# Patient Record
Sex: Female | Born: 1990 | Hispanic: Yes | Marital: Single | Smoking: Never smoker
Health system: Southern US, Community
[De-identification: ages and names within clinical notes are randomized; demographics above are authoritative.]

## PROBLEM LIST (undated history)

## (undated) DIAGNOSIS — R87629 Unspecified abnormal cytological findings in specimens from vagina: Secondary | ICD-10-CM

## (undated) DIAGNOSIS — O24419 Gestational diabetes mellitus in pregnancy, unspecified control: Secondary | ICD-10-CM

## (undated) HISTORY — PX: NO PAST SURGERIES: SHX2092

## (undated) HISTORY — DX: Unspecified abnormal cytological findings in specimens from vagina: R87.629

## (undated) HISTORY — DX: Gestational diabetes mellitus in pregnancy, unspecified control: O24.419

---
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
## 2015-12-21 NOTE — L&D Delivery Note (Signed)
Delivery Note At 12:14 AM a viable female was delivered via Vaginal, Spontaneous Delivery (Presentation: Right Occiput Anterior).  APGAR: 8, 9; weight 7 lb 12 oz (3515 g).   Placenta status: Intact, Spontaneous.  Cord: 3 vessels with the following complications: None.   Anesthesia: None  Episiotomy: None Lacerations: None Suture Repair: none Est. Blood Loss (mL): 100  Mom to postpartum.  Baby to Couplet care / Skin to Skin.  Loni Muse 07/11/2016, 5:27 AM

## 2016-01-12 LAB — OB RESULTS CONSOLE RUBELLA ANTIBODY, IGM: Rubella: IMMUNE

## 2016-01-12 LAB — OB RESULTS CONSOLE RPR: RPR: NONREACTIVE

## 2016-01-12 LAB — OB RESULTS CONSOLE GC/CHLAMYDIA
Chlamydia: NEGATIVE
Gonorrhea: NEGATIVE

## 2016-01-12 LAB — OB RESULTS CONSOLE ABO/RH: RH Type: POSITIVE

## 2016-01-12 LAB — OB RESULTS CONSOLE HIV ANTIBODY (ROUTINE TESTING): HIV: NONREACTIVE

## 2016-01-12 LAB — OB RESULTS CONSOLE HGB/HCT, BLOOD
HCT: 34 %
HEMOGLOBIN: 11.6 g/dL

## 2016-01-12 LAB — OB RESULTS CONSOLE VARICELLA ZOSTER ANTIBODY, IGG: Varicella: NON-IMMUNE/NOT IMMUNE

## 2016-01-12 LAB — OB RESULTS CONSOLE ANTIBODY SCREEN: ANTIBODY SCREEN: NEGATIVE

## 2016-01-12 LAB — OB RESULTS CONSOLE HEPATITIS B SURFACE ANTIGEN: HEP B S AG: NEGATIVE

## 2016-04-16 ENCOUNTER — Encounter: Payer: Self-pay | Admitting: *Deleted

## 2016-04-16 DIAGNOSIS — O0990 Supervision of high risk pregnancy, unspecified, unspecified trimester: Secondary | ICD-10-CM | POA: Insufficient documentation

## 2016-04-16 DIAGNOSIS — O24419 Gestational diabetes mellitus in pregnancy, unspecified control: Secondary | ICD-10-CM | POA: Insufficient documentation

## 2016-04-19 ENCOUNTER — Ambulatory Visit: Payer: Self-pay | Admitting: *Deleted

## 2016-04-19 ENCOUNTER — Encounter: Payer: Self-pay | Attending: Obstetrics & Gynecology | Admitting: Dietician

## 2016-04-19 DIAGNOSIS — Z029 Encounter for administrative examinations, unspecified: Secondary | ICD-10-CM | POA: Insufficient documentation

## 2016-04-19 DIAGNOSIS — O24419 Gestational diabetes mellitus in pregnancy, unspecified control: Secondary | ICD-10-CM

## 2016-04-19 NOTE — Progress Notes (Signed)
Nutrition Note: GDM diet education Pt reports eating 3 meals and 3 snacks daily.  Pt is taking PNV. Pt received verbal and written education on GDM diet.  Pt reports N & V and heartburn. Received written education on these topics. Pt does not plan to BF but encouraged to do so.  Pt has WIC. F/u in 4-6 weeks. Carloyn Mannerebekah Hersh Minney MS RD LDN

## 2016-04-26 ENCOUNTER — Encounter: Payer: Self-pay | Admitting: Obstetrics & Gynecology

## 2016-04-26 ENCOUNTER — Ambulatory Visit (INDEPENDENT_AMBULATORY_CARE_PROVIDER_SITE_OTHER): Payer: Self-pay | Admitting: Obstetrics & Gynecology

## 2016-04-26 VITALS — BP 115/79 | HR 72 | Wt 157.4 lb

## 2016-04-26 DIAGNOSIS — O24419 Gestational diabetes mellitus in pregnancy, unspecified control: Secondary | ICD-10-CM

## 2016-04-26 LAB — POCT URINALYSIS DIP (DEVICE)
Bilirubin Urine: NEGATIVE
GLUCOSE, UA: NEGATIVE mg/dL
Hgb urine dipstick: NEGATIVE
KETONES UR: NEGATIVE mg/dL
NITRITE: NEGATIVE
PROTEIN: NEGATIVE mg/dL
Specific Gravity, Urine: 1.015 (ref 1.005–1.030)
UROBILINOGEN UA: 0.2 mg/dL (ref 0.0–1.0)
pH: 7 (ref 5.0–8.0)

## 2016-04-26 NOTE — Progress Notes (Signed)
   Subjective:    Amy Hester is a G3P2001 3048w3d being seen today for her first obstetrical visit.  Her obstetrical history is significant for gestational diabetes. Patient does intend to breast feed. Pregnancy history fully reviewed.  Patient reports backache and occasional nausea, but does not consider these severe enough to warrant treatment.  Filed Vitals:   04/26/16 0917  BP: 115/79  Pulse: 72  Weight: 157 lb 6.4 oz (71.396 kg)    HISTORY: OB History  Gravida Para Term Preterm AB SAB TAB Ectopic Multiple Living  3 2 2       1     # Outcome Date GA Lbr Len/2nd Weight Sex Delivery Anes PTL Lv  3 Current           2 Term 06/18/08 1043w0d  6 lb (2.722 kg)  Vag-Spont None    1 Term 03/25/06 3872w0d  6 lb (2.722 kg) F Vag-Spont EPI N Y     Past Medical History  Diagnosis Date  . Vaginal Pap smear, abnormal     ASCUS with +HPV  . Gestational diabetes    Past Surgical History  Procedure Laterality Date  . No past surgeries     Family History  Problem Relation Age of Onset  . Diabetes Paternal Grandmother      Exam    Uterus:     Pelvic Exam:    Perineum: Not performed   Vulva: Not examined   Vagina:  Not examined    pH: n/a   Cervix: not performed   Adnexa: not evaluated   Bony Pelvis: not evaluated  System: Breast:  normal appearance, no masses or tenderness, not evaluated   Skin: normal coloration and turgor, no rashes    Neurologic: oriented, normal, normal mood   Extremities: Not performed   HEENT not performed   Mouth/Teeth mucous membranes moist, pharynx normal without lesions   Neck Not examined   Cardiovascular: Not performed   Respiratory:  appears well, vitals normal, no respiratory distress, acyanotic, normal RR, ear and throat exam is normal   Abdomen: normal findings: soft, non-tender   Urinary: not examined      Assessment:    Pregnancy: G3P2001 Patient Active Problem List   Diagnosis Date Noted  . Supervision of high risk  pregnancy, antepartum 04/16/2016  . Gestational diabetes mellitus (GDM), antepartum 04/16/2016        Plan:     Initial labs drawn. Prenatal vitamins. Problem list reviewed and updated. Genetic Screening discussed Quad Screen: results reviewed (negative for aneuploidy or ONTD).  Ultrasound discussed; fetal survey: results reviewed.  Follow up in 1 weeks. 50% of 10 min visit spent on counseling and coordination of care.   Patient has GDM currently treated with diet and exercise. She began checking blood sugars last week. Fasting blood sugars are borderline high; post-prandials are principally within normal limits. We discussed the importance of more consistent blood sugar checks and sent her to the Diabetes counselor to help coordinate her diet. We will follow-up at visit next week to examine blood sugars after implementation of new diet to determine whether or not we should begin Glyburide.    Amy Hester 04/26/2016

## 2016-04-26 NOTE — Progress Notes (Incomplete)
04/19/16: Diabetes Education Recent dx of GDM. Reports this id the third pregnancy.  Experienced GDM with 1st pregnancy. Review of GDM and the self-care measures. Encouraged to walk 30 minutes daily in the coolest part of the day. Provided instruction for the True-Track meter. She is to pick-up her meter tomorrow when the office staff calls her. To monitor 4 times per day (fasting, and 2 hour post meal glucose levels.) Reviewed s/s of hypoglycemia and the prevention and treatment interventions. Instructed to bring meter and glucose log to all clinic appointments. Maggie Jakai Onofre, RN, RD, LDN

## 2016-05-10 ENCOUNTER — Ambulatory Visit (INDEPENDENT_AMBULATORY_CARE_PROVIDER_SITE_OTHER): Payer: Self-pay | Admitting: Obstetrics & Gynecology

## 2016-05-10 ENCOUNTER — Encounter: Payer: Self-pay | Admitting: Obstetrics & Gynecology

## 2016-05-10 VITALS — BP 103/62 | HR 75 | Wt 158.7 lb

## 2016-05-10 DIAGNOSIS — O24410 Gestational diabetes mellitus in pregnancy, diet controlled: Secondary | ICD-10-CM

## 2016-05-10 DIAGNOSIS — Z23 Encounter for immunization: Secondary | ICD-10-CM

## 2016-05-10 DIAGNOSIS — IMO0002 Reserved for concepts with insufficient information to code with codable children: Secondary | ICD-10-CM

## 2016-05-10 DIAGNOSIS — O0993 Supervision of high risk pregnancy, unspecified, third trimester: Secondary | ICD-10-CM

## 2016-05-10 LAB — POCT URINALYSIS DIP (DEVICE)
BILIRUBIN URINE: NEGATIVE
Glucose, UA: NEGATIVE mg/dL
HGB URINE DIPSTICK: NEGATIVE
Ketones, ur: NEGATIVE mg/dL
NITRITE: NEGATIVE
PH: 7 (ref 5.0–8.0)
PROTEIN: NEGATIVE mg/dL
Specific Gravity, Urine: 1.015 (ref 1.005–1.030)
UROBILINOGEN UA: 0.2 mg/dL (ref 0.0–1.0)

## 2016-05-10 NOTE — Progress Notes (Signed)
Subjective:  Amy Hester is a 25 y.o. G3P2001 at 5871w3d being seen today for ongoing prenatal care.  She is currently monitored for the following issues for this high-risk pregnancy and has Supervision of high risk pregnancy, antepartum; Gestational diabetes mellitus (GDM), antepartum; and ASCUS with positive high risk HPV on her problem list.  Patient reports no complaints.  Contractions: Not present. Vag. Bleeding: None.  Movement: Present. Denies leaking of fluid.   The following portions of the patient's history were reviewed and updated as appropriate: allergies, current medications, past family history, past medical history, past social history, past surgical history and problem list. Problem list updated.  Objective:   Filed Vitals:   05/10/16 1113  BP: 103/62  Pulse: 75  Weight: 158 lb 11.2 oz (71.986 kg)    Fetal Status: Fetal Heart Rate (bpm): 152 Fundal Height: 31 cm Movement: Present     General:  Alert, oriented and cooperative. Patient is in no acute distress.  Skin: Skin is warm and dry. No rash noted.   Cardiovascular: Normal heart rate noted  Respiratory: Normal respiratory effort, no problems with respiration noted  Abdomen: Soft, gravid, appropriate for gestational age. Pain/Pressure: Absent     Pelvic: Vag. Bleeding: None     Cervical exam deferred        Extremities: Normal range of motion.  Edema: None  Mental Status: Normal mood and affect. Normal behavior. Normal judgment and thought content.   Urinalysis: Urine Protein: Negative Urine Glucose: Negative  Assessment and Plan:  Pregnancy: G3P2002 at 171w3d  1. ASCUS with positive high risk HPV -colpo postpartum  2. Diet controlled gestational diabetes mellitus in third trimester A few high CBGs in am (low 90s) this past week.  Will start bedtime snack.  Meet with diabetes educator to reinforce.  Evening walks. US for growth (Need anatomy US from [place]) - US MFM OB DETAIL +14 WK;  Future -tdap   Preterm labor symptoms and general obstetric precautions including but not limited to vaginal bleeding, contractions, leaking of fluid and fetal movement were reviewed in detail with the patient. Please refer to After Visit Summary for other counseling recommendations.  Return in about 2 weeks (around 05/24/2016).   Lesly DukesKelly H Leggett, MD

## 2016-05-10 NOTE — Progress Notes (Signed)
U/S scheduled 05/26 @ 330pm. Unable to obtain previous u/s from H.D in [facility]

## 2016-05-12 ENCOUNTER — Encounter: Payer: Self-pay | Admitting: *Deleted

## 2016-05-13 ENCOUNTER — Other Ambulatory Visit: Payer: Self-pay | Admitting: Obstetrics & Gynecology

## 2016-05-13 ENCOUNTER — Encounter (HOSPITAL_COMMUNITY): Payer: Self-pay

## 2016-05-13 ENCOUNTER — Ambulatory Visit (HOSPITAL_COMMUNITY)
Admission: RE | Admit: 2016-05-13 | Discharge: 2016-05-13 | Disposition: A | Discharge status: Home / Self Care | Payer: Self-pay | Source: Ambulatory Visit | Attending: Obstetrics & Gynecology | Admitting: Obstetrics & Gynecology

## 2016-05-13 VITALS — BP 102/65 | HR 64 | Wt 157.8 lb

## 2016-05-13 DIAGNOSIS — Z3A30 30 weeks gestation of pregnancy: Secondary | ICD-10-CM

## 2016-05-13 DIAGNOSIS — Z36 Encounter for antenatal screening of mother: Secondary | ICD-10-CM | POA: Insufficient documentation

## 2016-05-13 DIAGNOSIS — Z0489 Encounter for examination and observation for other specified reasons: Secondary | ICD-10-CM

## 2016-05-13 DIAGNOSIS — Z3689 Encounter for other specified antenatal screening: Secondary | ICD-10-CM

## 2016-05-13 DIAGNOSIS — O24410 Gestational diabetes mellitus in pregnancy, diet controlled: Secondary | ICD-10-CM

## 2016-05-13 DIAGNOSIS — O0993 Supervision of high risk pregnancy, unspecified, third trimester: Secondary | ICD-10-CM

## 2016-05-13 DIAGNOSIS — IMO0002 Reserved for concepts with insufficient information to code with codable children: Secondary | ICD-10-CM

## 2016-05-14 ENCOUNTER — Ambulatory Visit (HOSPITAL_COMMUNITY): Payer: Self-pay

## 2016-05-24 ENCOUNTER — Ambulatory Visit (INDEPENDENT_AMBULATORY_CARE_PROVIDER_SITE_OTHER): Payer: Self-pay | Admitting: Obstetrics and Gynecology

## 2016-05-24 VITALS — BP 109/62 | HR 69 | Wt 156.4 lb

## 2016-05-24 DIAGNOSIS — R896 Abnormal cytological findings in specimens from other organs, systems and tissues: Secondary | ICD-10-CM

## 2016-05-24 DIAGNOSIS — O0993 Supervision of high risk pregnancy, unspecified, third trimester: Secondary | ICD-10-CM

## 2016-05-24 DIAGNOSIS — O24410 Gestational diabetes mellitus in pregnancy, diet controlled: Secondary | ICD-10-CM

## 2016-05-24 DIAGNOSIS — IMO0002 Reserved for concepts with insufficient information to code with codable children: Secondary | ICD-10-CM

## 2016-05-24 LAB — POCT URINALYSIS DIP (DEVICE)
BILIRUBIN URINE: NEGATIVE
Glucose, UA: NEGATIVE mg/dL
Hgb urine dipstick: NEGATIVE
Ketones, ur: NEGATIVE mg/dL
Nitrite: NEGATIVE
Protein, ur: NEGATIVE mg/dL
Specific Gravity, Urine: 1.02 (ref 1.005–1.030)
UROBILINOGEN UA: 0.2 mg/dL (ref 0.0–1.0)
pH: 7 (ref 5.0–8.0)

## 2016-05-24 NOTE — Progress Notes (Signed)
[employer] [place]

## 2016-05-24 NOTE — Progress Notes (Signed)
Subjective:  Amy BridgemanSilvia Reyes Hester is a 25 y.o. G3P2002 at 2054w3d being seen today for ongoing prenatal care.  She is currently monitored for the following issues for this high-risk pregnancy and has Supervision of high risk pregnancy, antepartum; Gestational diabetes mellitus (GDM), antepartum; and ASCUS with positive high risk HPV on her problem list.  Patient reports no complaints.   The following portions of the patient's history were reviewed and updated as appropriate: allergies, current medications, past family history, past medical history, past social history, past surgical history and problem list. Problem list updated.  Objective:   Filed Vitals:   05/24/16 1029  BP: 109/62  Pulse: 69  Weight: 156 lb 6.4 oz (70.943 kg)    Fetal Status: Fetal Heart Rate (bpm): 156   Movement: Present     General:  Alert, oriented and cooperative. Patient is in no acute distress.  Skin: Skin is warm and dry. No rash noted.   Cardiovascular: Normal heart rate noted  Respiratory: Normal respiratory effort, no problems with respiration noted  Abdomen: Soft, gravid, appropriate for gestational age. Pain/Pressure: Present     Pelvic: Deferred   Extremities: Normal range of motion.  Edema: None  Mental Status: Normal mood and affect. Normal behavior. Normal judgment and thought content.   Urinalysis: Urine Protein: Negative Urine Glucose: Negative  Assessment and Plan:  Pregnancy: G3P2002 at 3354w3d  1. Supervision of high risk pregnancy, antepartum, third trimester -Routine care. Pt unsure of BC methods; options d/w pt -Needs tdap nv  2. Diet controlled gestational diabetes mellitus (GDM), antepartum -BS log reviewed and normal.  3. ASCUS with positive high risk HPV Colpo PP  Preterm labor symptoms and general obstetric precautions including but not limited to vaginal bleeding, contractions, leaking of fluid and fetal movement were reviewed in detail with the patient. Please refer to  After Visit Summary for other counseling recommendations.   Return in about 2 weeks (around 06/07/2016).    Bingharlie Jahmeir Geisen, MD

## 2016-06-07 ENCOUNTER — Ambulatory Visit (INDEPENDENT_AMBULATORY_CARE_PROVIDER_SITE_OTHER): Payer: Self-pay | Admitting: Obstetrics and Gynecology

## 2016-06-07 VITALS — BP 113/64 | HR 88 | Temp 98.0°F | Wt 161.1 lb

## 2016-06-07 DIAGNOSIS — O24410 Gestational diabetes mellitus in pregnancy, diet controlled: Secondary | ICD-10-CM

## 2016-06-07 DIAGNOSIS — O0993 Supervision of high risk pregnancy, unspecified, third trimester: Secondary | ICD-10-CM

## 2016-06-07 LAB — POCT URINALYSIS DIP (DEVICE)
BILIRUBIN URINE: NEGATIVE
GLUCOSE, UA: NEGATIVE mg/dL
Hgb urine dipstick: NEGATIVE
Ketones, ur: NEGATIVE mg/dL
LEUKOCYTES UA: NEGATIVE
NITRITE: NEGATIVE
Protein, ur: NEGATIVE mg/dL
SPECIFIC GRAVITY, URINE: 1.02 (ref 1.005–1.030)
UROBILINOGEN UA: 0.2 mg/dL (ref 0.0–1.0)
pH: 7 (ref 5.0–8.0)

## 2016-06-07 LAB — CBC
HCT: 32.5 % — ABNORMAL LOW (ref 35.0–45.0)
HEMOGLOBIN: 11 g/dL — AB (ref 11.7–15.5)
MCH: 28.9 pg (ref 27.0–33.0)
MCHC: 33.8 g/dL (ref 32.0–36.0)
MCV: 85.5 fL (ref 80.0–100.0)
MPV: 9.1 fL (ref 7.5–12.5)
PLATELETS: 237 10*3/uL (ref 140–400)
RBC: 3.8 MIL/uL (ref 3.80–5.10)
RDW: 14.5 % (ref 11.0–15.0)
WBC: 5.9 10*3/uL (ref 3.8–10.8)

## 2016-06-07 LAB — HIV ANTIBODY (ROUTINE TESTING W REFLEX): HIV: NONREACTIVE

## 2016-06-07 MED ORDER — PRENATAL VITAMINS PLUS 27-1 MG PO TABS: 1.0000 | ORAL_TABLET | Freq: Every day | ORAL | Status: AC

## 2016-06-07 NOTE — Progress Notes (Signed)
[employer]. C/o lower back and abd pain. Thinks she is having contractions sometimes at night.

## 2016-06-07 NOTE — Progress Notes (Signed)
Subjective:  Amy BridgemanSilvia Reyes Hester is a 25 y.o. G3P2002 at 388w3d being seen today for ongoing prenatal care.  She is currently monitored for the following issues for this high-risk pregnancy and has Supervision of high risk pregnancy, antepartum; Gestational diabetes mellitus (GDM), antepartum; and ASCUS with positive high risk HPV on her problem list.  Patient reports no complaints.  Contractions: Irregular. Vag. Bleeding: None.  Movement: Present. Denies leaking of fluid.   The following portions of the patient's history were reviewed and updated as appropriate: allergies, current medications, past family history, past medical history, past social history, past surgical history and problem list. Problem list updated.  Objective:   Filed Vitals:   06/07/16 1106  BP: 113/64  Pulse: 88  Temp: 98 F (36.7 C)  Weight: 161 lb 1.6 oz (73.074 kg)    Fetal Status: Fetal Heart Rate (bpm): 145   Movement: Present     General:  Alert, oriented and cooperative. Patient is in no acute distress.  Skin: Skin is warm and dry. No rash noted.   Cardiovascular: Normal heart rate noted  Respiratory: Normal respiratory effort, no problems with respiration noted  Abdomen: Soft, gravid, appropriate for gestational age. Pain/Pressure: Present     Pelvic: Cervical exam deferred        Extremities: Normal range of motion.  Edema: None  Mental Status: Normal mood and affect. Normal behavior. Normal judgment and thought content.   Urinalysis:      Assessment and Plan:  Pregnancy: G3P2002 at 448w3d  1. Supervision of high risk pregnancy, antepartum, third trimester - cbc, hiv, rpr - desires btl, says has medicaid. Signing consent today  2. Diet controlled gestational diabetes mellitus (GDM), antepartum - majority of fastings and PPs are at goal - re-scheduling growth u/s for closer to 38 wks   Preterm labor symptoms and general obstetric precautions including but not limited to vaginal bleeding,  contractions, leaking of fluid and fetal movement were reviewed in detail with the patient. Please refer to After Visit Summary for other counseling recommendations.  F/u 2 wks  Kathrynn RunningNoah Bedford Ona Rathert, MD

## 2016-06-07 NOTE — Progress Notes (Signed)
U/S rescheduled for 07/17 @ 10am.

## 2016-06-08 LAB — RPR

## 2016-06-14 ENCOUNTER — Ambulatory Visit (HOSPITAL_COMMUNITY): Payer: Self-pay

## 2016-06-28 ENCOUNTER — Ambulatory Visit (INDEPENDENT_AMBULATORY_CARE_PROVIDER_SITE_OTHER): Payer: Self-pay | Admitting: Obstetrics & Gynecology

## 2016-06-28 VITALS — BP 120/78 | HR 70 | Wt 163.6 lb

## 2016-06-28 DIAGNOSIS — O24419 Gestational diabetes mellitus in pregnancy, unspecified control: Secondary | ICD-10-CM

## 2016-06-28 DIAGNOSIS — Z113 Encounter for screening for infections with a predominantly sexual mode of transmission: Secondary | ICD-10-CM

## 2016-06-28 DIAGNOSIS — O0993 Supervision of high risk pregnancy, unspecified, third trimester: Secondary | ICD-10-CM

## 2016-06-28 LAB — POCT URINALYSIS DIP (DEVICE)
BILIRUBIN URINE: NEGATIVE
Glucose, UA: NEGATIVE mg/dL
HGB URINE DIPSTICK: NEGATIVE
KETONES UR: NEGATIVE mg/dL
LEUKOCYTES UA: NEGATIVE
Nitrite: NEGATIVE
PH: 6.5 (ref 5.0–8.0)
Protein, ur: NEGATIVE mg/dL
Specific Gravity, Urine: 1.02 (ref 1.005–1.030)
Urobilinogen, UA: 0.2 mg/dL (ref 0.0–1.0)

## 2016-06-28 LAB — OB RESULTS CONSOLE GBS: STREP GROUP B AG: NEGATIVE

## 2016-06-28 NOTE — Progress Notes (Signed)
FBS 82-95, PP 95-135  Subjective:  Amy Hester is a 25 y.o. G3P2002 at 7575w3d being seen today for ongoing prenatal care.  She is currently monitored for the following issues for this high-risk pregnancy and has Supervision of high risk pregnancy, antepartum; Gestational diabetes mellitus (GDM), antepartum; and ASCUS with positive high risk HPV on her problem list.  Patient reports occasional contractions.  Contractions: Irregular. Vag. Bleeding: None.  Movement: Present. Denies leaking of fluid.   The following portions of the patient's history were reviewed and updated as appropriate: allergies, current medications, past family history, past medical history, past social history, past surgical history and problem list. Problem list updated.  Objective:   Filed Vitals:   06/28/16 0828  BP: 120/78  Pulse: 70  Weight: 163 lb 9.6 oz (74.208 kg)    Fetal Status: Fetal Heart Rate (bpm): 145   Movement: Present     General:  Alert, oriented and cooperative. Patient is in no acute distress.  Skin: Skin is warm and dry. No rash noted.   Cardiovascular: Normal heart rate noted  Respiratory: Normal respiratory effort, no problems with respiration noted  Abdomen: Soft, gravid, appropriate for gestational age. Pain/Pressure: Present     Pelvic:  Cervical exam performed        Extremities: Normal range of motion.  Edema: None  Mental Status: Normal mood and affect. Normal behavior. Normal judgment and thought content.   Urinalysis: Urine Protein: Negative Urine Glucose: Negative  Assessment and Plan:  Pregnancy: G3P2002 at 7075w3d  1. Supervision of high risk pregnancy, antepartum, third trimester Good BG control  Term labor symptoms and general obstetric precautions including but not limited to vaginal bleeding, contractions, leaking of fluid and fetal movement were reviewed in detail with the patient. Please refer to After Visit Summary for other counseling recommendations.   Return in about 1 week (around 07/05/2016).   Adam PhenixJames G Negan Grudzien, MD

## 2016-06-28 NOTE — Patient Instructions (Signed)
Tercer trimestre de embarazo (Third Trimester of Pregnancy) El tercer trimestre comprende desde la semana29 hasta la semana42, es decir, desde el mes7 hasta el mes9. El tercer trimestre es un perodo en el que el feto crece rpidamente. Hacia el final del noveno mes, el feto mide alrededor de 20pulgadas (45cm) de largo y pesa entre 6 y 10 libras (2,700 y 4,500kg).  CAMBIOS EN EL ORGANISMO Su organismo atraviesa por muchos cambios durante el embarazo, y estos varan de una mujer a otra.   Seguir aumentando de peso. Es de esperar que aumente entre 25 y 35libras (11 y 16kg) hacia el final del embarazo.  Podrn aparecer las primeras estras en las caderas, el abdomen y las mamas.  Puede tener necesidad de orinar con ms frecuencia porque el feto baja hacia la pelvis y ejerce presin sobre la vejiga.  Debido al embarazo podr sentir acidez estomacal con frecuencia.  Puede estar estreida, ya que ciertas hormonas enlentecen los movimientos de los msculos que empujan los desechos a travs de los intestinos.  Pueden aparecer hemorroides o abultarse e hincharse las venas (venas varicosas).  Puede sentir dolor plvico debido al aumento de peso y a que las hormonas del embarazo relajan las articulaciones entre los huesos de la pelvis. El dolor de espalda puede ser consecuencia de la sobrecarga de los msculos que soportan la postura.  Tal vez haya cambios en el cabello que pueden incluir su engrosamiento, crecimiento rpido y cambios en la textura. Adems, a algunas mujeres se les cae el cabello durante o despus del embarazo, o tienen el cabello seco o fino. Lo ms probable es que el cabello se le normalice despus del nacimiento del beb.  Las mamas seguirn creciendo y le dolern. A veces, puede haber una secrecin amarilla de las mamas llamada calostro.  El ombligo puede salir hacia afuera.  Puede sentir que le falta el aire debido a que se expande el tero.  Puede notar que el feto  "baja" o lo siente ms bajo, en el abdomen.  Puede tener una prdida de secrecin mucosa con sangre. Esto suele ocurrir en el trmino de unos pocos das a una semana antes de que comience el trabajo de parto.  El cuello del tero se vuelve delgado y blando (se borra) cerca de la fecha de parto. QU DEBE ESPERAR EN LOS EXMENES PRENATALES  Le harn exmenes prenatales cada 2semanas hasta la semana36. A partir de ese momento le harn exmenes semanales. Durante una visita prenatal de rutina:  La pesarn para asegurarse de que usted y el feto estn creciendo normalmente.  Le tomarn la presin arterial.  Le medirn el abdomen para controlar el desarrollo del beb.  Se escucharn los latidos cardacos fetales.  Se evaluarn los resultados de los estudios solicitados en visitas anteriores.  Le revisarn el cuello del tero cuando est prxima la fecha de parto para controlar si este se ha borrado. Alrededor de la semana36, el mdico le revisar el cuello del tero. Al mismo tiempo, realizar un anlisis de las secreciones del tejido vaginal. Este examen es para determinar si hay un tipo de bacteria, estreptococo Grupo B. El mdico le explicar esto con ms detalle. El mdico puede preguntarle lo siguiente:  Cmo le gustara que fuera el parto.  Cmo se siente.  Si siente los movimientos del beb.  Si ha tenido sntomas anormales, como prdida de lquido, sangrado, dolores de cabeza intensos o clicos abdominales.  Si est consumiendo algn producto que contenga tabaco, como cigarrillos, tabaco   de mascar y cigarrillos electrnicos.  Si tiene alguna pregunta. Otros exmenes o estudios de deteccin que pueden realizarse durante el tercer trimestre incluyen lo siguiente:  Anlisis de sangre para controlar los niveles de hierro (anemia).  Controles fetales para determinar su salud, nivel de actividad y crecimiento. Si tiene alguna enfermedad o hay problemas durante el embarazo, le harn  estudios.  Prueba del VIH (virus de inmunodeficiencia humana). Si corre un riesgo alto, pueden realizarle una prueba de deteccin del VIH durante el tercer trimestre del embarazo. FALSO TRABAJO DE PARTO Es posible que sienta contracciones leves e irregulares que finalmente desaparecen. Se llaman contracciones de Braxton Hicks o falso trabajo de parto. Las contracciones pueden durar horas, das o incluso semanas, antes de que el verdadero trabajo de parto se inicie. Si las contracciones ocurren a intervalos regulares, se intensifican o se hacen dolorosas, lo mejor es que la revise el mdico.  SIGNOS DE TRABAJO DE PARTO   Clicos de tipo menstrual.  Contracciones cada 5minutos o menos.  Contracciones que comienzan en la parte superior del tero y se extienden hacia abajo, a la zona inferior del abdomen y la espalda.  Sensacin de mayor presin en la pelvis o dolor de espalda.  Una secrecin de mucosidad acuosa o con sangre que sale de la vagina. Si tiene alguno de estos signos antes de la semana37 del embarazo, llame a su mdico de inmediato. Debe concurrir al hospital para que la controlen inmediatamente. INSTRUCCIONES PARA EL CUIDADO EN EL HOGAR   Evite fumar, consumir hierbas, beber alcohol y tomar frmacos que no le hayan recetado. Estas sustancias qumicas afectan la formacin y el desarrollo del beb.  No consuma ningn producto que contenga tabaco, lo que incluye cigarrillos, tabaco de mascar y cigarrillos electrnicos. Si necesita ayuda para dejar de fumar, consulte al mdico. Puede recibir asesoramiento y otro tipo de recursos para dejar de fumar.  Siga las indicaciones del mdico en relacin con el uso de medicamentos. Durante el embarazo, hay medicamentos que son seguros de tomar y otros que no.  Haga ejercicio solamente como se lo haya indicado el mdico. Sentir clicos uterinos es un buen signo para detener la actividad fsica.  Contine comiendo alimentos sanos con  regularidad.  Use un sostn que le brinde buen soporte si le duelen las mamas.  No se d baos de inmersin en agua caliente, baos turcos ni saunas.  Use el cinturn de seguridad en todo momento mientras conduce.  No coma carne cruda ni queso sin cocinar; evite el contacto con las bandejas sanitarias de los gatos y la tierra que estos animales usan. Estos elementos contienen grmenes que pueden causar defectos congnitos en el beb.  Tome las vitaminas prenatales.  Tome entre 1500 y 2000mg de calcio diariamente comenzando en la semana20 del embarazo hasta el parto.  Si est estreida, pruebe un laxante suave (si el mdico lo autoriza). Consuma ms alimentos ricos en fibra, como vegetales y frutas frescos y cereales integrales. Beba gran cantidad de lquido para mantener la orina de tono claro o color amarillo plido.  Dese baos de asiento con agua tibia para aliviar el dolor o las molestias causadas por las hemorroides. Use una crema para las hemorroides si el mdico la autoriza.  Si tiene venas varicosas, use medias de descanso. Eleve los pies durante 15minutos, 3 o 4veces por da. Limite el consumo de sal en su dieta.  Evite levantar objetos pesados, use zapatos de tacones bajos y mantenga una buena postura.  Descanse   con las piernas elevadas si tiene calambres o dolor de cintura.  Visite a su dentista si no lo ha hecho durante el embarazo. Use un cepillo de dientes blando para higienizarse los dientes y psese el hilo dental con suavidad.  Puede seguir manteniendo relaciones sexuales, a menos que el mdico le indique lo contrario.  No haga viajes largos excepto que sea absolutamente necesario y solo con la autorizacin del mdico.  Tome clases prenatales para entender, practicar y hacer preguntas sobre el trabajo de parto y el parto.  Haga un ensayo de la partida al hospital.  Prepare el bolso que llevar al hospital.  Prepare la habitacin del beb.  Concurra a todas  las visitas prenatales segn las indicaciones de su mdico. SOLICITE ATENCIN MDICA SI:  No est segura de que est en trabajo de parto o de que ha roto la bolsa de las aguas.  Tiene mareos.  Siente clicos leves, presin en la pelvis o dolor persistente en el abdomen.  Tiene nuseas, vmitos o diarrea persistentes.  Observa una secrecin vaginal con mal olor.  Siente dolor al orinar. SOLICITE ATENCIN MDICA DE INMEDIATO SI:   Tiene fiebre.  Tiene una prdida de lquido por la vagina.  Tiene sangrado o pequeas prdidas vaginales.  Siente dolor intenso o clicos en el abdomen.  Sube o baja de peso rpidamente.  Tiene dificultad para respirar y siente dolor de pecho.  Sbitamente se le hinchan mucho el rostro, las manos, los tobillos, los pies o las piernas.  No ha sentido los movimientos del beb durante una hora.  Siente un dolor de cabeza intenso que no se alivia con medicamentos.  Su visin se modifica.   Esta informacin no tiene como fin reemplazar el consejo del mdico. Asegrese de hacerle al mdico cualquier pregunta que tenga.   Document Released: 09/15/2005 Document Revised: 12/27/2014 Elsevier Interactive Patient Education 2016 [employer]  

## 2016-06-29 LAB — GC/CHLAMYDIA PROBE AMP (~~LOC~~) NOT AT ARMC
Chlamydia: NEGATIVE
Neisseria Gonorrhea: NEGATIVE

## 2016-06-30 LAB — CULTURE, BETA STREP (GROUP B ONLY)

## 2016-07-05 ENCOUNTER — Other Ambulatory Visit (HOSPITAL_COMMUNITY): Payer: Self-pay | Admitting: Maternal and Fetal Medicine

## 2016-07-05 ENCOUNTER — Ambulatory Visit (INDEPENDENT_AMBULATORY_CARE_PROVIDER_SITE_OTHER): Payer: Self-pay | Admitting: Obstetrics and Gynecology

## 2016-07-05 ENCOUNTER — Encounter (HOSPITAL_COMMUNITY): Payer: Self-pay

## 2016-07-05 ENCOUNTER — Ambulatory Visit (HOSPITAL_COMMUNITY)
Admission: RE | Admit: 2016-07-05 | Discharge: 2016-07-05 | Disposition: A | Discharge status: Home / Self Care | Payer: Self-pay | Source: Ambulatory Visit | Attending: Obstetrics & Gynecology | Admitting: Obstetrics & Gynecology

## 2016-07-05 VITALS — BP 120/73 | HR 69 | Wt 166.3 lb

## 2016-07-05 DIAGNOSIS — IMO0002 Reserved for concepts with insufficient information to code with codable children: Secondary | ICD-10-CM

## 2016-07-05 DIAGNOSIS — O24419 Gestational diabetes mellitus in pregnancy, unspecified control: Secondary | ICD-10-CM

## 2016-07-05 DIAGNOSIS — O0993 Supervision of high risk pregnancy, unspecified, third trimester: Secondary | ICD-10-CM

## 2016-07-05 DIAGNOSIS — Z0489 Encounter for examination and observation for other specified reasons: Secondary | ICD-10-CM

## 2016-07-05 DIAGNOSIS — Z3A38 38 weeks gestation of pregnancy: Secondary | ICD-10-CM

## 2016-07-05 DIAGNOSIS — O24410 Gestational diabetes mellitus in pregnancy, diet controlled: Secondary | ICD-10-CM

## 2016-07-05 DIAGNOSIS — Z36 Encounter for antenatal screening of mother: Secondary | ICD-10-CM | POA: Insufficient documentation

## 2016-07-05 LAB — POCT URINALYSIS DIP (DEVICE)
BILIRUBIN URINE: NEGATIVE
GLUCOSE, UA: NEGATIVE mg/dL
Hgb urine dipstick: NEGATIVE
Ketones, ur: NEGATIVE mg/dL
NITRITE: NEGATIVE
Protein, ur: NEGATIVE mg/dL
Specific Gravity, Urine: 1.01 (ref 1.005–1.030)
UROBILINOGEN UA: 0.2 mg/dL (ref 0.0–1.0)
pH: 7 (ref 5.0–8.0)

## 2016-07-05 NOTE — Patient Instructions (Signed)
Induccin del trabajo de parto (Augmentation of Labor) La induccin del Spring Hilltrabajo de parto hace referencia al uso de mtodos para estimular y Chief Operating Officerfortalecer las contracciones uterinas durante el Elgintrabajo de [place]oparto. Esto se realiza cuando las contracciones son ms lentas o se detienen, demorando el progreso del Valle Vistatrabajo de parto y Scientist, water qualityel alumbramiento. Antes de comenzar con la induccin del trabajo de [place]parto, el mdico evaluar la condicin de la madre y el beb, el tamao y la posicin del beb y el tamao del canal de [place]arto. CULES SON LOS MOTIVOS DE LA INDUCCIN DEL TRABAJO DE PARTO? Los motivos de la induccin del [place] View[place]abajo de parto incluyen:   Trabajo de parto lento (prolongacin de la primera y segunda etapa del trabajo de parto), que se ha relacionado con un aumento de los riesgos maternos, como corioamnionitis, hemorragia posparto, parto vaginal instrumentado o desgarro perineal de tercer o Rich Numbercuarto grado.  Reduccin de la duracin promedio del trabajo de [place]. QU MTODOS SE UTILIZAN PARA LA INDUCCIN DEL TRABAJO DE PARTO? Se pueden emplear varios mtodos para la induccin del trabajo de [place]arto, que incluyen:   [place]landOxitocina. Este medicamento estimula las contracciones. Se administrar a travs de un catter por va intravenosa (IV) que se inserta en una vena.  Rotura del saco lleno de lquido que rodea al feto (saco amnitico).  Ruptura de las Leuppmembranas. El mdico separa el tejido del saco amnitico del cuello uterino, causando la liberacin de una hormona llamada progesterona, que puede estimular las contracciones uterinas.  Estimulacin de los pezones.  Estimulacin de ciertos puntos de presin en los tobillos.  Dilatacin manual o mecnica del cuello del tero. CULES SON LOS RIESGOS ASOCIADOS CON LA INDUCCIN DEL TRABAJO DE PARTO?  Sobreestimulacin de las contracciones uterinas (contracciones continuas, prolongadas, muy fuertes), causando distrs fetal.  Mayores posibilidades de infeccin en la  madre o el beb.  Desgarro uterino (ruptura).  Ruptura (abrupcin) de la placenta.  Mayores probabilidades de parto por cesrea, con frceps o ventosa. CULES SON LOS MOTIVOS PARA NO REALIZAR UNA INDUCCIN DEL TRABAJO DE PARTO? La induccin del Davidsvilletrabajo de parto no se debe realizar si:  El beb es demasiado grande para el canal de [place]parto. Esto puede confirmarse con Sherlyn Leesuna ecografa.  El cordn umbilical cae por delante de la cabeza o las nalgas del beb (cordn prolapsado).  La madre tuvo una cesrea previa con Neomia Dearuna incisin vertical en el tero (o la clase de incisin utilizada se desconoce). No se debe usar una dosis alta de oxitocina si la madre tuvo una cesrea previa de cualquier clase.  La madre tuvo una ciruga previa en el tero.  La madre tiene herpes.  La madre tiene cncer de cuello del tero.  El beb est en posicin transversal.  La pelvis de la madre est deformada.  La madre est embarazada de ms de [employer] bebs.   Esta informacin no tiene Theme park managercomo fin reemplazar el consejo del mdico. Asegrese de hacerle al mdico cualquier pregunta que tenga.   Document Released: 05/24/2008 Document Revised: 09/26/2013 Elsevier Interactive Patient Education [employer]2016 Elsevier Inc.

## 2016-07-05 NOTE — Progress Notes (Signed)
Subjective:  Amy Hester is a 25 y.o. G3P2002 at 3737w3d being seen today for ongoing prenatal care.  She is currently monitored for the following issues for this high-risk pregnancy and has Supervision of high risk pregnancy, antepartum; Gestational diabetes mellitus (GDM), antepartum; and ASCUS with positive high risk HPV on her problem list.  Patient reports contractions since since 4am.  Contractions: Irregular about every 15 minutes.  .  Movement: Present. Denies leaking of fluid.   The following portions of the patient's history were reviewed and updated as appropriate: allergies, current medications, past family history, past medical history, past social history, past surgical history and problem list. Problem list updated.  Objective:   Filed Vitals:   07/05/16 0759  BP: 120/73  Pulse: 69  Weight: 166 lb 4.8 oz (75.433 kg)    Fetal Status: Fetal Heart Rate (bpm): 142   Movement: Present     General:  Alert, oriented and cooperative. Patient is in no acute distress.  Skin: Skin is warm and dry. No rash noted.   Cardiovascular: Normal heart rate noted  Respiratory: Normal respiratory effort, no problems with respiration noted  Abdomen: Soft, gravid, appropriate for gestational age. Pain/Pressure: Present     Pelvic:  Cervical exam performed       1/50/-3  Extremities: Normal range of motion.  Edema: None  Mental Status: Normal mood and affect. Normal behavior. Normal judgment and thought content.   Urinalysis:      Assessment and Plan:  Pregnancy: G3P2002 at 7137w3d  1. Diet controlled gestational diabetes mellitus (GDM), antepartum Sugars have been <100 fasting except for one day they were 110. 2 hour post prandial at goal.   2. Supervision of high risk pregnancy, antepartum, third trimester Pt put on the induction schedule for 40wks.   Term labor symptoms and general obstetric precautions including but not limited to vaginal bleeding, contractions, leaking of  fluid and fetal movement were reviewed in detail with the patient. Please refer to After Visit Summary for other counseling recommendations.  No Follow-up on file.   Lorne SkeensNicholas Michael Schenk, MD

## 2016-07-08 ENCOUNTER — Encounter (HOSPITAL_COMMUNITY): Payer: Self-pay | Admitting: *Deleted

## 2016-07-08 ENCOUNTER — Telehealth (HOSPITAL_COMMUNITY): Payer: Self-pay | Admitting: *Deleted

## 2016-07-08 NOTE — Telephone Encounter (Signed)
Preadmission screen  [phone] interpreter number

## 2016-07-10 ENCOUNTER — Encounter (HOSPITAL_COMMUNITY): Payer: Self-pay | Admitting: *Deleted

## 2016-07-10 ENCOUNTER — Inpatient Hospital Stay (HOSPITAL_COMMUNITY)
Admission: AD | Admit: 2016-07-10 | Discharge: 2016-07-13 | DRG: 775 | Disposition: A | Discharge status: Home / Self Care | Payer: Medicaid Other | Source: Ambulatory Visit | Attending: Family Medicine | Admitting: Family Medicine

## 2016-07-10 DIAGNOSIS — Z3A39 39 weeks gestation of pregnancy: Secondary | ICD-10-CM

## 2016-07-10 DIAGNOSIS — Z833 Family history of diabetes mellitus: Secondary | ICD-10-CM | POA: Diagnosis not present

## 2016-07-10 DIAGNOSIS — O4202 Full-term premature rupture of membranes, onset of labor within 24 hours of rupture: Secondary | ICD-10-CM | POA: Diagnosis present

## 2016-07-10 DIAGNOSIS — O0993 Supervision of high risk pregnancy, unspecified, third trimester: Secondary | ICD-10-CM

## 2016-07-10 DIAGNOSIS — O24410 Gestational diabetes mellitus in pregnancy, diet controlled: Secondary | ICD-10-CM

## 2016-07-10 DIAGNOSIS — Z3490 Encounter for supervision of normal pregnancy, unspecified, unspecified trimester: Secondary | ICD-10-CM

## 2016-07-10 DIAGNOSIS — O24420 Gestational diabetes mellitus in childbirth, diet controlled: Secondary | ICD-10-CM

## 2016-07-10 LAB — CBC
HCT: 33.3 % — ABNORMAL LOW (ref 36.0–46.0)
Hemoglobin: 11.4 g/dL — ABNORMAL LOW (ref 12.0–15.0)
MCH: 29.5 pg (ref 26.0–34.0)
MCHC: 34.2 g/dL (ref 30.0–36.0)
MCV: 86.3 fL (ref 78.0–100.0)
PLATELETS: 191 10*3/uL (ref 150–400)
RBC: 3.86 MIL/uL — ABNORMAL LOW (ref 3.87–5.11)
RDW: 16.1 % — AB (ref 11.5–15.5)
WBC: 5.6 10*3/uL (ref 4.0–10.5)

## 2016-07-10 LAB — POCT FERN TEST: POCT FERN TEST: POSITIVE

## 2016-07-10 LAB — TYPE AND SCREEN
ABO/RH(D): O POS
Antibody Screen: NEGATIVE

## 2016-07-10 LAB — GLUCOSE, RANDOM: Glucose, Bld: 82 mg/dL (ref 65–99)

## 2016-07-10 MED ORDER — FLEET ENEMA 7-19 GM/118ML RE ENEM: 1.0000 | ENEMA | RECTAL | Status: DC | PRN

## 2016-07-10 MED ORDER — LACTATED RINGERS IV SOLN
INTRAVENOUS | Status: DC
  Administered 2016-07-10: 22:00:00 via INTRAVENOUS

## 2016-07-10 MED ORDER — LACTATED RINGERS IV SOLN
500.0000 mL | INTRAVENOUS | Status: DC | PRN
  Administered 2016-07-10: 500 mL via INTRAVENOUS

## 2016-07-10 MED ORDER — ONDANSETRON HCL 4 MG/2ML IJ SOLN: 4.0000 mg | Freq: Four times a day (QID) | INTRAMUSCULAR | Status: DC | PRN

## 2016-07-10 MED ORDER — FENTANYL CITRATE (PF) 100 MCG/2ML IJ SOLN: 100.0000 ug | INTRAMUSCULAR | Status: DC | PRN

## 2016-07-10 MED ORDER — OXYTOCIN BOLUS FROM INFUSION
500.0000 mL | INTRAVENOUS | Status: DC
  Administered 2016-07-11: 500 mL via INTRAVENOUS

## 2016-07-10 MED ORDER — SOD CITRATE-CITRIC ACID 500-334 MG/5ML PO SOLN
30.0000 mL | ORAL | Status: DC | PRN
Start: 2016-07-10 — End: 2016-07-12

## 2016-07-10 MED ORDER — OXYCODONE-ACETAMINOPHEN 5-325 MG PO TABS
1.0000 | ORAL_TABLET | ORAL | Status: DC | PRN
Start: 2016-07-10 — End: 2016-07-12

## 2016-07-10 MED ORDER — FENTANYL CITRATE (PF) 100 MCG/2ML IJ SOLN
INTRAMUSCULAR | Status: AC
  Administered 2016-07-10: 100 ug
  Filled 2016-07-10: qty 2

## 2016-07-10 MED ORDER — OXYTOCIN 40 UNITS IN LACTATED RINGERS INFUSION - SIMPLE MED
1.0000 m[IU]/min | INTRAVENOUS | Status: DC
  Administered 2016-07-10: 2 m[IU]/min via INTRAVENOUS

## 2016-07-10 MED ORDER — TERBUTALINE SULFATE 1 MG/ML IJ SOLN
0.2500 mg | Freq: Once | INTRAMUSCULAR | Status: DC | PRN
  Filled 2016-07-10: qty 1

## 2016-07-10 MED ORDER — ACETAMINOPHEN 325 MG PO TABS: 650.0000 mg | ORAL_TABLET | ORAL | Status: DC | PRN

## 2016-07-10 MED ORDER — OXYCODONE-ACETAMINOPHEN 5-325 MG PO TABS
2.0000 | ORAL_TABLET | ORAL | Status: DC | PRN
Start: 2016-07-10 — End: 2016-07-12

## 2016-07-10 MED ORDER — OXYTOCIN 40 UNITS IN LACTATED RINGERS INFUSION - SIMPLE MED
2.5000 [IU]/h | INTRAVENOUS | Status: DC
  Filled 2016-07-10: qty 1000

## 2016-07-10 MED ORDER — LIDOCAINE HCL (PF) 1 % IJ SOLN
30.0000 mL | INTRAMUSCULAR | Status: DC | PRN
  Filled 2016-07-10: qty 30

## 2016-07-10 NOTE — Progress Notes (Signed)
LABOR PROGRESS NOTE  Amy Hester is a 25 y.o. G3P2002 at [redacted]w[redacted]d  admitted for SROM@1600  7/22.  Subjective: Pt resting comfortably.  Objective: BP 125/80 mmHg  Pulse 73  Temp(Src) 98 F (36.7 C) (Oral)  Resp 18  Ht 5\' 5"  (1.651 m)  Wt 75.297 kg (166 lb)  BMI 27.62 kg/m2  LMP 10/10/2015 or  Filed Vitals:   07/10/16 1807 07/10/16 1833 07/10/16 1850 07/10/16 1934  BP: 134/77   125/80  Pulse: 71   73  Temp: 97.5 F (36.4 C)   98 F (36.7 C)  TempSrc: Oral   Oral  Resp: 18  18 18   Height:  5\' 5"  (1.651 m)    Weight:  75.297 kg (166 lb)      Dilation: 4 Effacement (%): 80 Station: -2 Presentation: Vertex Exam by:: [email or web address], rn  Labs: Lab Results  Component Value Date   WBC 5.6 07/10/2016   HGB 11.4* 07/10/2016   HCT 33.3* 07/10/2016   MCV 86.3 07/10/2016   PLT 191 07/10/2016    Patient Active Problem List   Diagnosis Date Noted  . Term pregnancy, repeat 07/10/2016  . ASCUS with positive high risk HPV 05/10/2016  . Supervision of high risk pregnancy, antepartum 04/16/2016  . Gestational diabetes mellitus (GDM), antepartum 04/16/2016    Assessment / Plan: 25 y.o. G3P2002 at [redacted]w[redacted]d here for SROM.   Labor: starting pit Fetal Wellbeing:  Cat 1 Pain Control:  Desires IV when pain escalates Anticipated MOD:  SVD  [redacted]w[redacted]d, MD 07/10/2016, 9:21 PM

## 2016-07-10 NOTE — H&P (Signed)
LABOR AND DELIVERY ADMISSION HISTORY AND PHYSICAL NOTE  Amy Hester is a 25 y.o. female G3P2002 with IUP at [redacted]w[redacted]d by LMP, c/w 19 wk [place], presenting for SROM, labor.   She reports SROM after her shower with a large gush, continual leakage to hospital. Contracting since 6AM, q10 min, but not very strong or close together. She reports positive fetal movement. She denies vaginal bleeding.  Prenatal History/Complications:   Gestational diabetes, suspected macrosomia (>90%, estimated 8#5oz), ASCUS +HPV.  Past Medical History: Past Medical History  Diagnosis Date  . Vaginal Pap smear, abnormal     ASCUS with +HPV  . Gestational diabetes     diet controlled    Past Surgical History: Past Surgical History  Procedure Laterality Date  . No past surgeries      Obstetrical History: OB History    Gravida Para Term Preterm AB TAB SAB Ectopic Multiple Living   Social History: Social History   Social History  . Marital Status: Single    Spouse Name: N/A  . Number of Children: N/A  . Years of Education: N/A   Social History Main Topics  . Smoking status: Never Smoker   . Smokeless tobacco: Never Used  . Alcohol Use: No  . Drug Use: No  . Sexual Activity: Yes    Birth Control/ Protection: None   Other Topics Concern  . None   Social History Narrative    Family History: Family History  Problem Relation Age of Onset  . Diabetes Paternal Grandmother     Allergies: No Known Allergies  Prescriptions prior to admission  Medication Sig Dispense Refill Last Dose  . Prenatal Vit-Fe Fumarate-FA (PRENATAL VITAMINS PLUS) 27-1 MG TABS Take 1 tablet by mouth daily. 30 tablet 6 Taking  . Prenatal Vit-Fe Fumarate-FA (PRENATAL VITAMINS) 28-0.8 MG TABS Take by mouth. Reported on 07/05/2016   Not Taking     Review of Systems   All systems reviewed and negative except as stated in HPI  Blood pressure 134/83, pulse 82, temperature 98.4 F (36.9 C), resp.  rate 18, last menstrual period 10/10/2015. General appearance: alert, cooperative, appears stated age and no distress Lungs: clear to auscultation bilaterally Heart: regular rate and rhythm Abdomen: soft, non-tender; bowel sounds normal Extremities: No calf swelling or tenderness Presentation: cephalic Fetal monitoring: 150, mod variability, +accels, no decels Uterine activity: irregular, q 10 min  Dilation: 3 Effacement (%): 50 Station: -3 Exam by:: Dr. Omer Jack Leaking clear fluid  Prenatal labs: ABO, Rh: O/Positive/-- (01/23 0000) Antibody: Negative (01/23 0000) Rubella: !Error! IMMUNE RPR: NON REAC (06/19 1228)  HBsAg: Negative (01/23 0000)  HIV: NONREACTIVE (06/19 1228)  GBS: Negative (07/10 0000)  Varicella: NON-Immune  1 hr Glucola: 151, 3-hr GTT (FBS- 98, 1-hr 197...) Genetic screening:  Normal QUAD Anatomy US: Normal anatomy, suspected macrosomia  Prenatal Transfer Tool  Maternal Diabetes: Yes:  Diabetes Type:  Diet controlled Genetic Screening: Normal Maternal Ultrasounds/Referrals: Normal Fetal Ultrasounds or other Referrals:  None Maternal Substance Abuse:  No Significant Maternal Medications:  None Significant Maternal Lab Results: None  Results for orders placed or performed during the hospital encounter of 07/10/16 (from the past 24 hour(s))  Fern Test   Collection Time: 07/10/16  5:03 PM  Result Value Ref Range   POCT Fern Test Positive = ruptured amniotic membanes     Patient Active Problem List   Diagnosis Date Noted  . ASCUS  with positive high risk HPV 05/10/2016  . Supervision of high risk pregnancy, antepartum 04/16/2016  . Gestational diabetes mellitus (GDM), antepartum 04/16/2016    Assessment: Amy Hester is a 25 y.o. G3P2002 at [redacted]w[redacted]d here for SROM/labor, known gestation diabetes diet controlled.   #Labor: SROM, clear fluid, expectant management #Pain: Possible epidural, upon maternal request #FWB:   Category 1 #ID:  GBS  Neg #MOF: both #MOC: BTL papers signed 6/19?; undecided #Circ:  N/A- girl # CBG q4h Jen Mow, DO 07/10/2016, 6:04 PM

## 2016-07-10 NOTE — H&P (Signed)
Amy Hester is a 25 y.o. female [redacted]w[redacted]d determined by LMP presenting for SROM, labor.  Patient states her contractions are about 20 minutes apart, and minimally painful, with positive fetal movements. She has had leakage of fluid through out the day. Denies vaginal bleeding.  Maternal Medical History:  Reason for admission: Contractions.   Contractions: Onset was 3-5 hours ago.   Frequency: irregular.   Perceived severity is mild.    Fetal activity: Perceived fetal activity is normal.   Last perceived fetal movement was within the past hour.    Prenatal Complications - Diabetes: gestational. Diabetes is managed by diet.      OB History    Gravida Para Term Preterm AB TAB SAB Ectopic Multiple Living   3 2 2       2      Past Medical History  Diagnosis Date  . Vaginal Pap smear, abnormal     ASCUS with +HPV  . Gestational diabetes     diet controlled   Past Surgical History  Procedure Laterality Date  . No past surgeries     Family History: family history includes Diabetes in her paternal grandmother. Social History:  reports that she has never smoked. She has never used smokeless tobacco. She reports that she does not drink alcohol or use illicit drugs.   Prenatal Transfer Tool  Maternal Diabetes: Yes:  Diabetes Type:  Diet controlled A1 Genetic Screening: Normal Maternal Substance Abuse:  No Significant Maternal Medications:  None Significant Maternal Lab Results:  None Other Comments:  None  Review of Systems  Constitutional: Negative.   Eyes: Negative for blurred vision and double vision.  Respiratory: Negative.   Cardiovascular: Negative.   Gastrointestinal: Negative.   Genitourinary: Negative.   Skin: Negative.   Neurological: Negative for headaches.    Dilation: 3 Effacement (%): 50 Station: -3 Exam by:: Dr. Omer Jack Blood pressure 134/77, pulse 71, temperature 97.5 F (36.4 C), temperature source Oral, resp. rate 18, height 5\' 5"  (1.651 m),  weight 75.297 kg (166 lb), last menstrual period 10/10/2015. Maternal Exam:  Uterine Assessment: Contraction strength is mild.  Contraction duration is 2 minutes. Contraction frequency is irregular.   Abdomen: not evaluated.  Introitus: not evaluated.   Cervix: not evaluated.   Physical Exam  Constitutional: She is oriented to person, place, and time. She appears well-developed and well-nourished.  HENT:  Head: Normocephalic.  Mouth/Throat: Oropharynx is clear and moist.  Eyes: No scleral icterus.  Cardiovascular: Normal rate and regular rhythm.   Respiratory: Effort normal and breath sounds normal.  GI:  gravid  Neurological: She is alert and oriented to person, place, and time.  Skin: Skin is warm and dry.    Prenatal labs: ABO, Rh: O/Positive/-- (01/23 0000) Antibody: Negative (01/23 0000) Rubella: Immune (01/23 0000) RPR: NON REAC (06/19 1228)  HBsAg: Negative (01/23 0000)  HIV: NONREACTIVE (06/19 1228)  GBS: Negative (07/10 0000)   Assessment/Plan: 25 year old G3P2002 @ [redacted]w[redacted]d presenting for SROM and labor Vaginal birth planned Pain management with IV pain medications or nitrous oxide, does not currently want an epidural  GBS negative on 7/10 Gender is female.  Patient plans to breast and bottle feed.   Contraception: DepoProvera   Clyde Canterbury, PA-S  07/10/2016, 6:38 PM

## 2016-07-10 NOTE — MAU Note (Addendum)
Pt reports her water broke 30 min ago. Clear fluid ctx getting stronger

## 2016-07-11 ENCOUNTER — Encounter (HOSPITAL_COMMUNITY): Payer: Self-pay | Admitting: *Deleted

## 2016-07-11 LAB — RPR: RPR Ser Ql: NONREACTIVE

## 2016-07-11 LAB — ABO/RH: ABO/RH(D): O POS

## 2016-07-11 MED ORDER — BENZOCAINE-MENTHOL 20-0.5 % EX AERO: 1.0000 "application " | INHALATION_SPRAY | CUTANEOUS | Status: DC | PRN

## 2016-07-11 MED ORDER — IBUPROFEN 600 MG PO TABS
ORAL_TABLET | ORAL | Status: AC
Start: 2016-07-11 — End: 2016-07-11
  Administered 2016-07-11: 600 mg
  Filled 2016-07-11: qty 1

## 2016-07-11 MED ORDER — ONDANSETRON HCL 4 MG/2ML IJ SOLN: 4.0000 mg | INTRAMUSCULAR | Status: DC | PRN

## 2016-07-11 MED ORDER — TETANUS-DIPHTH-ACELL PERTUSSIS 5-2.5-18.5 LF-MCG/0.5 IM SUSP: 0.5000 mL | Freq: Once | INTRAMUSCULAR | Status: DC

## 2016-07-11 MED ORDER — ONDANSETRON HCL 4 MG PO TABS: 4.0000 mg | ORAL_TABLET | ORAL | Status: DC | PRN

## 2016-07-11 MED ORDER — ZOLPIDEM TARTRATE 5 MG PO TABS: 5.0000 mg | ORAL_TABLET | Freq: Every evening | ORAL | Status: DC | PRN

## 2016-07-11 MED ORDER — DIBUCAINE 1 % RE OINT
1.0000 | TOPICAL_OINTMENT | RECTAL | Status: DC | PRN
Start: 2016-07-11 — End: 2016-07-12

## 2016-07-11 MED ORDER — SIMETHICONE 80 MG PO CHEW: 80.0000 mg | CHEWABLE_TABLET | ORAL | Status: DC | PRN

## 2016-07-11 MED ORDER — COCONUT OIL OIL: 1.0000 "application " | TOPICAL_OIL | Status: DC | PRN

## 2016-07-11 MED ORDER — IBUPROFEN 600 MG PO TABS
600.0000 mg | ORAL_TABLET | Freq: Four times a day (QID) | ORAL | Status: DC
  Administered 2016-07-11 – 2016-07-12 (×5): 600 mg via ORAL
  Filled 2016-07-11 (×5): qty 1

## 2016-07-11 MED ORDER — ACETAMINOPHEN 325 MG PO TABS: 650.0000 mg | ORAL_TABLET | ORAL | Status: DC | PRN

## 2016-07-11 MED ORDER — PRENATAL MULTIVITAMIN CH
1.0000 | ORAL_TABLET | Freq: Every day | ORAL | Status: DC
  Administered 2016-07-11: 1 via ORAL
  Filled 2016-07-11: qty 1

## 2016-07-11 MED ORDER — WITCH HAZEL-GLYCERIN EX PADS: 1.0000 "application " | MEDICATED_PAD | CUTANEOUS | Status: DC | PRN

## 2016-07-11 MED ORDER — SENNOSIDES-DOCUSATE SODIUM 8.6-50 MG PO TABS
2.0000 | ORAL_TABLET | ORAL | Status: DC
Start: 2016-07-12 — End: 2016-07-12
  Administered 2016-07-11: 2 via ORAL
  Filled 2016-07-11: qty 2

## 2016-07-11 MED ORDER — DIPHENHYDRAMINE HCL 25 MG PO CAPS: 25.0000 mg | ORAL_CAPSULE | Freq: Four times a day (QID) | ORAL | Status: DC | PRN

## 2016-07-11 NOTE — Lactation Note (Signed)
This note was copied from a baby's chart. Lactation Consultation Note  Patient Name: Amy Hester Date: 07/11/2016 Reason for consult: Initial assessment This is Mom's 3rd baby but 1st time BF. Mom denies questions or concerns. Basic teaching reviewed. Encouraged to BF with feeding ques, encouraged STS. Lactation brochure left for review, advised of OP services and support group. Baby recently BF and asleep. Encouraged Mom to call for assist.  Wallene Huh, the in-house Spanish interpreter present for visit.   Maternal Data Has patient been taught Hand Expression?: Yes Does the patient have breastfeeding experience prior to this delivery?: No  Feeding Feeding Type: Breast Fed Length of feed: 15 min  LATCH Score/Interventions                      Lactation Tools Discussed/Used WIC Program: Yes   Consult Status Consult Status: Follow-up Date: 07/12/16 Follow-up type: In-patient    Alfred Levins 07/11/2016, 5:55 PM

## 2016-07-11 NOTE — Progress Notes (Signed)
Assessment actually done at 1100.

## 2016-07-12 MED ORDER — IBUPROFEN 600 MG PO TABS
600.0000 mg | ORAL_TABLET | ORAL | Status: DC | PRN
Start: 2016-07-12 — End: 2016-07-13
  Administered 2016-07-12 – 2016-07-13 (×5): 600 mg via ORAL
  Filled 2016-07-12 (×4): qty 1

## 2016-07-12 MED ORDER — PRENATAL MULTIVITAMIN CH
1.0000 | ORAL_TABLET | Freq: Every day | ORAL | Status: DC
  Administered 2016-07-12 – 2016-07-13 (×2): 1 via ORAL
  Filled 2016-07-12: qty 1

## 2016-07-12 MED ORDER — ACETAMINOPHEN 325 MG PO TABS: 650.0000 mg | ORAL_TABLET | ORAL | 0 refills | Status: AC | PRN

## 2016-07-12 MED ORDER — IBUPROFEN 600 MG PO TABS: 600.0000 mg | ORAL_TABLET | ORAL | 0 refills | Status: AC | PRN

## 2016-07-12 NOTE — Progress Notes (Signed)
.   Post Partum Day 1 Subjective: no complaints, up ad lib, voiding, tolerating PO and + flatus  Objective: Blood pressure 113/73, pulse 70, temperature 98.2 F (36.8 C), temperature source Oral, resp. rate 18, height 5\' 5"  (1.651 m), weight 166 lb (75.3 kg), last menstrual period 10/10/2015, SpO2 98 %, unknown if currently breastfeeding.  Physical Exam:  General: alert, cooperative and no distress Lochia: appropriate Uterine Fundus: firm DVT Evaluation: No evidence of DVT seen on physical exam.   Recent Labs  07/10/16 1815  HGB 11.4*  HCT 33.3*    Assessment/Plan: Plan for discharge tomorrow, decided to stay 1 more day with baby girl who needs to stay for elevated bili. Get fasting glucose in the morning b/c of her h/o gestational diabetes (A1).  Breast/bottle Depo   LOS: 2 days   Andres Ege, MD, PGY-1, MPH 07/12/2016, 12:59 PM

## 2016-07-12 NOTE — Progress Notes (Signed)
Patient did not want me to order her lunch. Amy Hester Interpreter °

## 2016-07-12 NOTE — Lactation Note (Signed)
This note was copied from a baby's chart. Lactation Consultation Note Follow up visit at 46 hours of age with Spanish interpreter, Nettie Elm.  Mom reports good feedings.  Mom has been giving breast and bottle feedings formula.  Mom reports RN talked to her about increasing breast feeding to increase supply and not offering formula as mom is doing well with breast feedings.  Mom is able to return demonstration of hand expression with colostrum flowing easily.  Mom is encouraged and plans to work more on breast feedings.  Mom denies other concerns at this time.  Discussed feeding frequency and cluster feedings.  Mom to call for assist as needed.   Patient Name: Amy Hester [id number] Date: 07/12/2016 Reason for consult: Follow-up assessment   Maternal Data    Feeding    LATCH Score/Interventions                      Lactation Tools Discussed/Used     Consult Status Consult Status: Follow-up Date: 07/13/16 Follow-up type: In-patient    Shoptaw, Arvella Merles 07/12/2016, 10:21 PM

## 2016-07-12 NOTE — Discharge Instructions (Signed)
Please check fasting glucose level in the the morning at home, if it is greater than 100, please follow up with your primary care provider in clinic  Antes de la llegada del beb al hogar (Before Baby Comes Home) Antes de que nazca el beb, es importante que se asegure de lo siguiente:  Tener todos los suministros que necesitar para el cuidado del beb.  Saber dnde ir si hay una emergencia.  Haber hablado de la llegada del beb con otros familiares. QU SUMINISTROS NECESITAR? Se recomienda que cuente con los siguientes suministros: Objetos grandes  [place].  Colchn para la cuna.  Asiento de seguridad [employer] atrs para el beb. Si es posible, un profesional capacitado debe garantizar que se haya instalado correctamente. Alimentacin  De 6a 8biberones con una capacidad de 4a 5onzas.  De 6a 8tetinas.  Un cepillo para biberones.  Un esterilizador, o una olla o pava grande con tapa.  Un mtodo para hervir y Financial trader.  Si amamantar:  Un sacaleches.  Crema para los pezones.  Sostn para Museum/gallery exhibitions officer.  Almohadillas para las mamas.  Pezoneras.  Si alimentar al beb con Alexis Goodell maternizada:  Leche maternizada.  Tazas medidoras.  Cucharas medidoras. El bao  Jabn y [place] para beb.  Vaselina.  Un pao y [place].  Una toalla con capucha.  [place].  Un lavatorio o recipiente para baar al beb. Otros suministros  Termmetro rectal.  Pera de goma.  Paos o toallitas para cuando le cambie el paal.  Bolsa para paales.  Cambiador.  Ropa, incluidos conjuntos enterizos y pijamas.  Alicates para bebs.  Mantas para envolver al beb.  Cubrecolchn y sbanas para la cuna.  Luz nocturna para el cuarto del beb.  Monitor para el beb.  De 2 a 3 chupetes.  De 24a 36paales de tela y protectores impermeables o una bolsa de paales descartables. Puede necesitar de 10 a 12paales al da. CMO ME PREPARO  PARA UNA EMERGENCIA? Preprese para una emergencia de la siguiente manera:  Sepa cmo llegar al hospital ms cercano.  Tenga una lista de los nmeros telefnicos de los profesionales que la atienden y que atendern al beb cerca del telfono fijo y en el telfono mvil. CMO PREPARO A MI FAMILIA?  Decida cmo organizar las visitas.  Si tiene otros hijos:  Hable con ellos sobre la llegada del beb al hogar. Pregnteles cmo se sienten.  Lales un libro sobre lo que significa ser un hermano o una hermana mayor.  Encuentre formas de permitirles que la ayuden a prepararse para el nuevo beb.  Tenga a alguien que est listo para cuidarlos cuando usted est en el hospital.   Esta informacin no tiene Theme park manager el consejo del mdico. Asegrese de hacerle al mdico cualquier pregunta que tenga.   Document Released: 03/04/2009 Document Revised: 04/22/2015 Elsevier Interactive Patient Education 2016 [employer].  Control del nivel de glucosa en la sangre - Adultos (Blood Glucose Monitoring, Adult) El control de la glucosa en la sangre (tambin llamada azcar en la sangre) lo ayudar a tener la diabetes bajo control. Tambin ayuda a que usted y Lexicographer la diabetes y determinen si el tratamiento es Engineer, manufacturing. POR QU HAY QUE CONTROLAR LA GLUCOSA EN LA SANGRE?  Esto puede ayudar a comprender de [employer], la actividad fsica y los medicamentos inciden en los niveles de [place].  Le permite conocer el nivel de glucosa en la sangre en cualquier momento dado. Puede saber  rpidamente si el nivel es bajo (hipoglucemia) o alto (hiperglucemia).  Puede ser de ayuda para que usted y el mdico sepan cmo Presenter, broadcasting,  y para entender cmo controlar una enfermedad o ajustar los medicamentos para hacer ejercicio. CUNDO DEBE HACERSE LAS PRUEBAS? El mdico lo ayudar a decidir con qu frecuencia deber [employer] niveles de glucosa en la [place].  Esto puede depender del tipo de diabetes que tenga, su control de la diabetes o los tipos de medicamentos que tome. Asegrese de anotar todos los valores de la glucosa en la [place], de modo que esta informacin pueda ser revisada por su mdico. A continuacin puede ver ejemplos de los momentos para Education officer, environmental la prueba que el mdico puede Neurosurgeon. Diabetes tipo1  Mdaselo al menos 2 veces al da si la diabetes est bien controlada, si [place] una bomba de insulina o si se aplica muchas inyecciones diarias.  Si la diabetes no est bien controlada o si est enfermo, puede ser necesario que se controle con ms frecuencia.  Es recomendable que tambin lo mida en estas oportunidades:  Antes de cada inyeccin de insulina.  Antes y despus de hacer ejercicio.  Entre las comidas y 2horas despus de Arts administrator.  Ocasionalmente, entre las 2:00a.m. y las 3:00a.m. Diabetes tipo2  Si est utilizando insulina, realice la medicin al menos 2 veces al [employer]. Sin embargo, es Insurance claims handler medicin antes de cada inyeccin de [place].  Si toma medicamentos por boca (va oral), hgase la prueba 2veces por da.  Si sigue una dieta controlada, hgase la prueba una vez por da.  Si la diabetes no est bien controlada o si est enfermo, puede ser necesario que se controle con ms frecuencia. CMO CONTROLAR EL NIVEL DE GLUCOSA EN LA SANGRE Insumos necesarios  Medidor de glucosa en la sangre.  Tiras reactivas para el medidor. Cada medidor tiene sus propias tiras reactivas. Debe usar las tiras reactivas correspondientes a su medidor.  Una aguja para pinchar (lanceta).  Un dispositivo que sujeta la lanceta (dispositivo de puncin).  Un diario o libro de anotaciones para [employer]. Procedimiento  Lave sus manos con agua y [place]. No se recomienda usar alcohol.  Pnchese el costado del dedo (no la punta) con Optometrist.  Apriete suavemente el dedo hasta que aparezca una pequea gota de  [place].  Siga las instrucciones que vienen con el medidor para Public affairs consultant tira Firefighter, Contractor la sangre sobre la tira y usar el medidor de Horticulturist, commercial. Otras zonas de las que se puede tomar sangre para la prueba Algunos medidores le permiten tomar sangre para la prueba de otras zonas del cuerpo (que no son el dedo). Estas reas se llaman sitios alternativos. Los sitios alternativos ms comunes son los siguientes:  El Product manager.  El muslo.  La zona posterior de la parte inferior de la pierna.  La palma de la mano. El flujo de sangre en estas zonas es ms lento. Por lo tanto, los valores de glucosa en la sangre que obtenga pueden estar demorados, y los nmeros son diferentes de los que obtiene de los dedos. No saque sangre de sitios alternativos si cree que tiene hipoglucemia. Los valores no sern precisos. Siempre extraiga del dedo si tiene hipoglucemia. Adems, si no puede darse cuenta cuando tiene bajos los niveles (hipoglucemia asintomtica), siempre extraiga sangre de los dedos para los controles de glucosa en la [place]. CONSEJOS ADICIONALES PARA EL CONTROL DE LA GLUCOSA  No vuelva a utilizar las lancetas.  Siempre tenga los insumos a mano.  Todos los medidores de glucosa incluyen un nmero de telfono "directo", disponible las 24 horas, al que podr llamar si tiene preguntas o [place].  Ajuste (calibre) el medidor de glucosa con una solucin de control despus de terminar algunas cajas de tiras reactivas. LLEVE REGISTROS DE LOS NIVELES DE GLUCOSA EN LA SANGRE Es recomendable llevar un diario o un registro de los valores de glucosa en la [place]. La [employer] de los medidores de glucosa, sino todos, conservan el registro de la glucosa en el dispositivo. Algunos medidores permiten descargar los registros a su computadora. Llevar un registro de los valores de glucosa en la sangre es especialmente til si desea observar los patrones. Haga anotaciones simultneas con la  Microbiologist de los valores de glucosa en la sangre debido a que podra olvidar lo que ocurri en el momento exacto. Llevar un buen registro los ayudar a usted y al mdico a Printmaker juntos para Personnel officer un buen control de la diabetes.    Esta informacin no tiene Theme park manager el consejo del mdico. Asegrese de hacerle al mdico cualquier pregunta que tenga.   Document Released: 12/06/2005 Document Revised: 12/27/2014 Elsevier Interactive Patient Education [employer]

## 2016-07-13 LAB — GLUCOSE, CAPILLARY: Glucose-Capillary: 70 mg/dL (ref 65–99)

## 2016-07-13 NOTE — Lactation Note (Signed)
This note was copied from a baby's chart. Lactation Consultation Note Follow up consult with this mom and term baby. Mom is breast feeding and then offering formula in bottle. Dr. Ezequiel Essex in room with EDA. Baby is starting phototherapy lights today. Hand pump given to mom on case she has trouble latching with lights. Mom knows to call for questions/concerns to lactation as needed.  Patient Name: Amy Hester Date: 07/13/2016 Reason for consult: Follow-up assessment   Maternal Data    Feeding Feeding Type: Breast Fed Length of feed: 5 min  LATCH Score/Interventions                      Lactation Tools Discussed/Used     Consult Status Consult Status: Follow-up Date: 07/14/16 Follow-up type: In-patient    Alfred Levins 07/13/2016, 10:00 AM

## 2016-07-13 NOTE — Progress Notes (Signed)
I assisted Dr. Holly Bodily with questions. Eda H Royal Interpreter. I also assisted Dr. Ezequiel Essex with explanation of care plan for the baby.

## 2016-07-13 NOTE — Discharge Summary (Signed)
Obstetric Discharge Summary Reason for Admission: rupture of membranes, labor Prenatal Procedures: none Intrapartum Procedures: NA Postpartum Procedures: none Complications-Operative and Postpartum: none Hemoglobin  Date Value Ref Range Status  07/10/2016 11.4 (L) 12.0 - 15.0 g/dL Final  44/02/4741 59.5 g/dL Final   HCT  Date Value Ref Range Status  07/10/2016 33.3 (L) 36.0 - 46.0 % Final  01/12/2016 34 % Final    Physical Exam:  General: alert, cooperative and no distress Lochia: appropriate Uterine Fundus: firm Incision: NA  DVT Evaluation: No evidence of DVT seen on physical exam.  Discharge Diagnoses: Term Pregnancy-delivered  Discharge Information: Date: 07/13/2016 Activity: pelvic rest Diet: routine Medications: Ibuprofen and Tylenol for pain Condition: stable Instructions: refer to practice specific booklet, return to clinic in 6 weeks for a fasting glucose tolerance test given the diagnosis of gestational diabetes. Discharge to: home   Newborn Data: Live born female  Birth Weight: 7 lb 12 oz (3515 g) APGAR: 8, 9  Home with mother.  Andres Ege, MD, PGY-1, MPH 07/13/2016, 8:01 AM   CNM attestation I have seen and examined this patient and agree with above documentation in the resident's note.   Amy Hester is a 25 y.o. [phone] s/p SVD.   Pain is well controlled.  Plan for birth control is Depo-Provera.  Method of Feeding: both  PE:  BP 116/61   Pulse (!) 55   Temp 97.9 F (36.6 C) (Oral)   Resp 18   Ht 5\' 5"  (1.651 m)   Wt 75.3 kg (166 lb)   LMP 10/10/2015   SpO2 98%   Breastfeeding? Unknown   BMI 27.62 kg/m  Fundus firm  No results for input(s): HGB, HCT in the last 72 hours.   Plan: discharge today - postpartum care discussed - f/u clinic in 6 weeks for postpartum visit including 2hr glucola   SHAW, KIMBERLY, CNM 9:09 AM

## 2016-07-14 ENCOUNTER — Ambulatory Visit: Payer: Self-pay

## 2016-07-14 NOTE — Lactation Note (Signed)
This note was copied from a baby's chart. Lactation Consultation Note  Mother denies any questions for the lactation consultant and reports BF is "good".  Patient Name: Amy Hester Date: 07/14/2016     Maternal Data    Feeding Feeding Type: Bottle Fed - Formula Nipple Type: Slow - flow Length of feed: 20 min  LATCH Score/Interventions Latch: Grasps breast easily, tongue down, lips flanged, rhythmical sucking.  Audible Swallowing: Spontaneous and intermittent Intervention(s): Skin to skin  Type of Nipple: Everted at rest and after stimulation  Comfort (Breast/Nipple): Soft / non-tender     Hold (Positioning): No assistance needed to correctly position infant at breast.  LATCH Score: 10  Lactation Tools Discussed/Used     Consult Status      Amy Hester 07/14/2016, 11:30 AM

## 2016-07-16 ENCOUNTER — Inpatient Hospital Stay (HOSPITAL_COMMUNITY): Admission: RE | Admit: 2016-07-16 | Payer: Medicaid Other | Source: Ambulatory Visit

## 2016-07-26 IMAGING — US US MFM OB COMP +14 WKS
1 series · 14 of 28 positions shown · non-contrast
Comparison: none

[Series 1: us mfm ob comp +14 wks · 105 acquisitions, 14 frames shown]
[im 4/105]
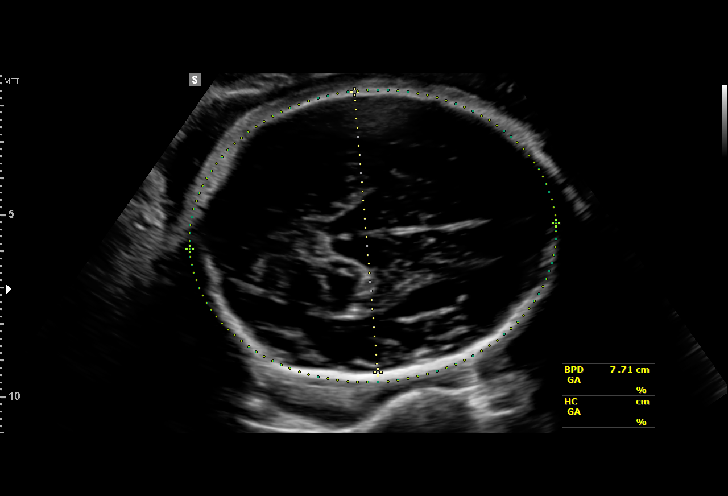
[im 12/105]
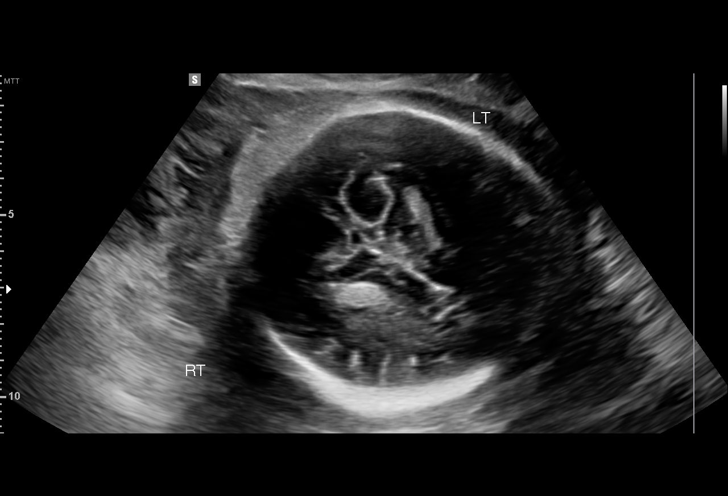
[im 20/105]
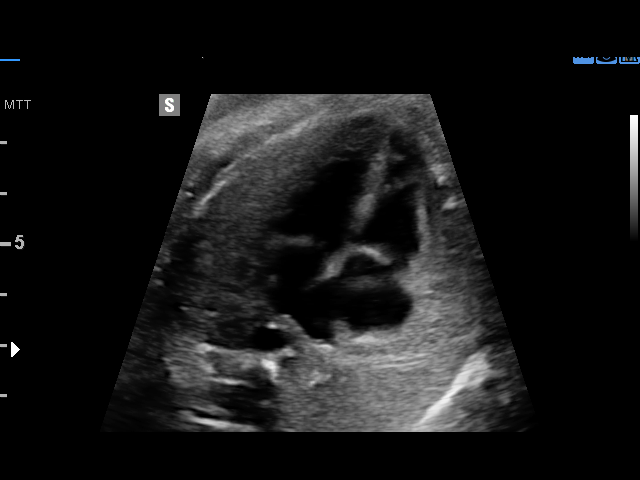
[im 27/105]
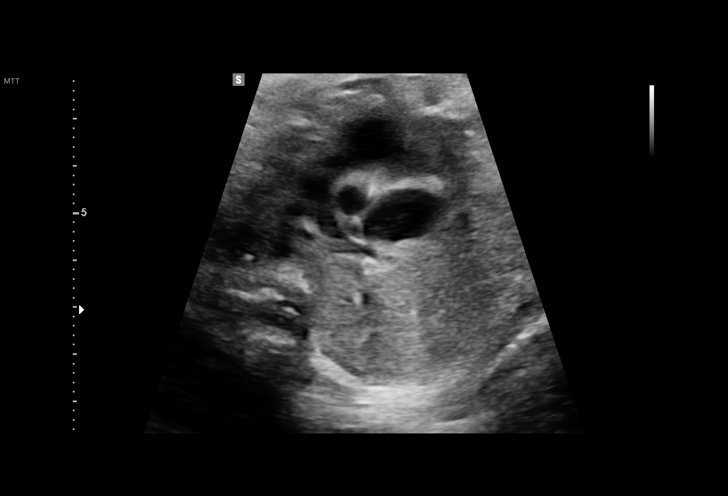
[im 35/105]
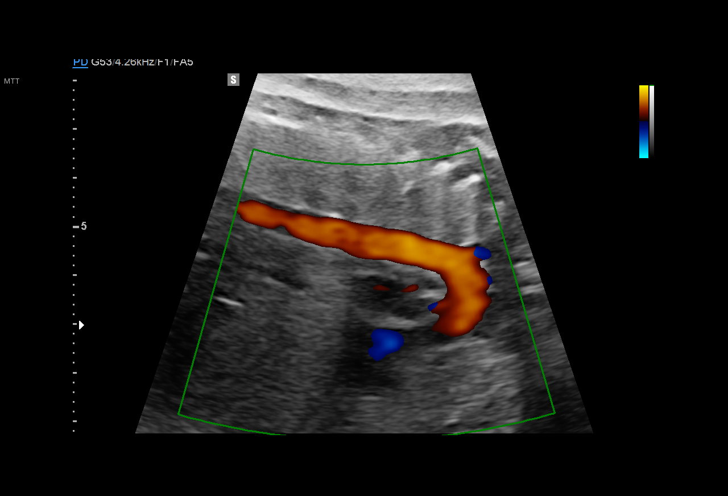
[im 43/105]
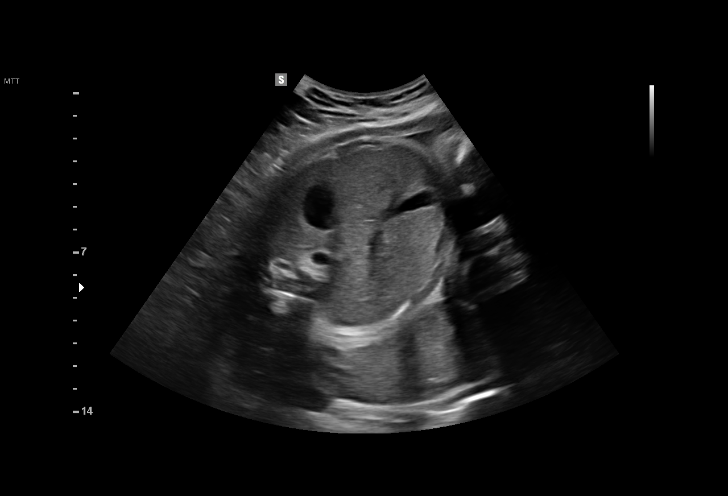
[im 51/105]
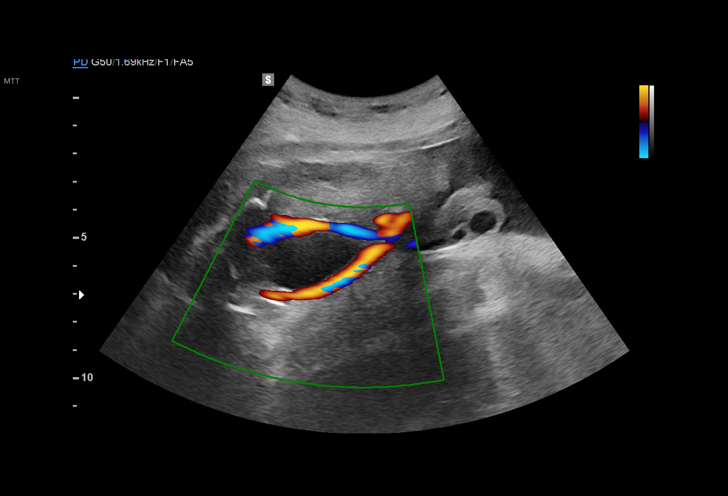
[im 58/105]
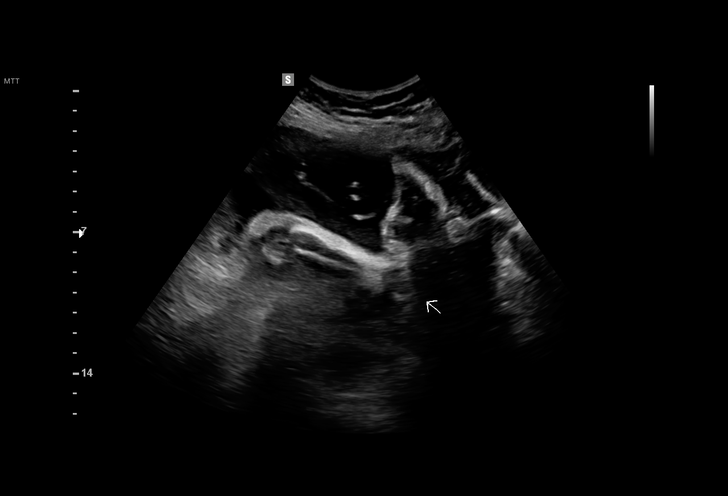
[im 66/105]
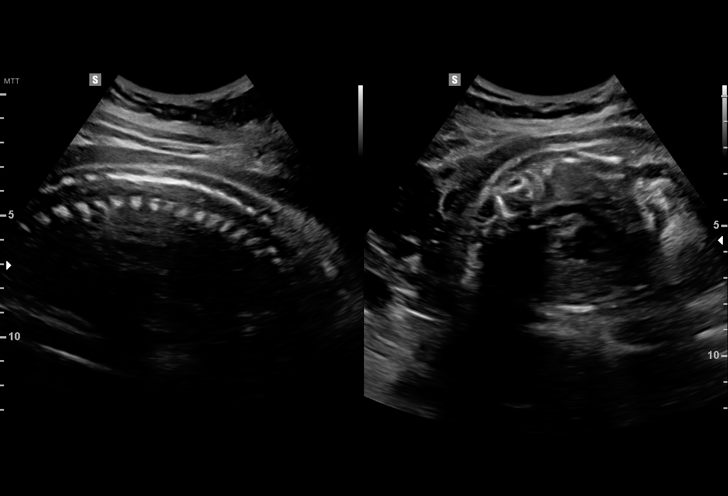
[im 74/105]
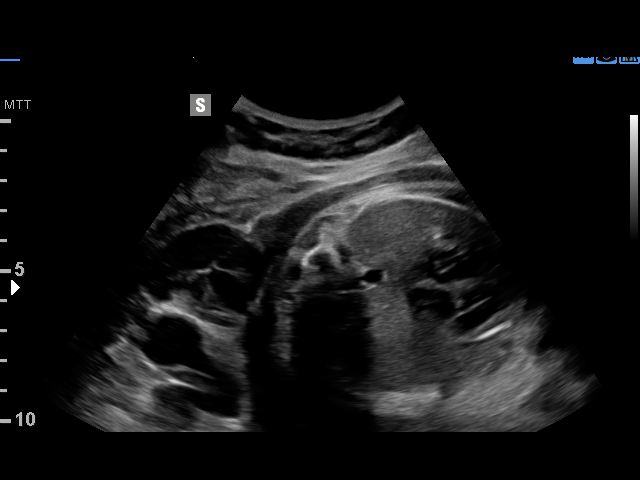
[im 81/105]
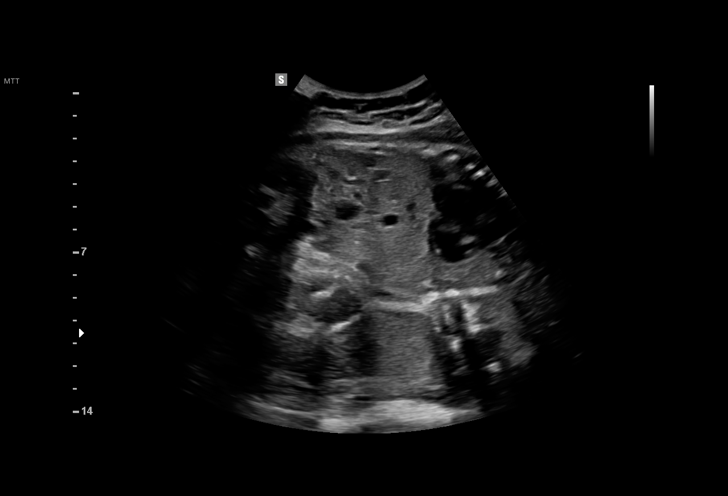
[im 89/105]
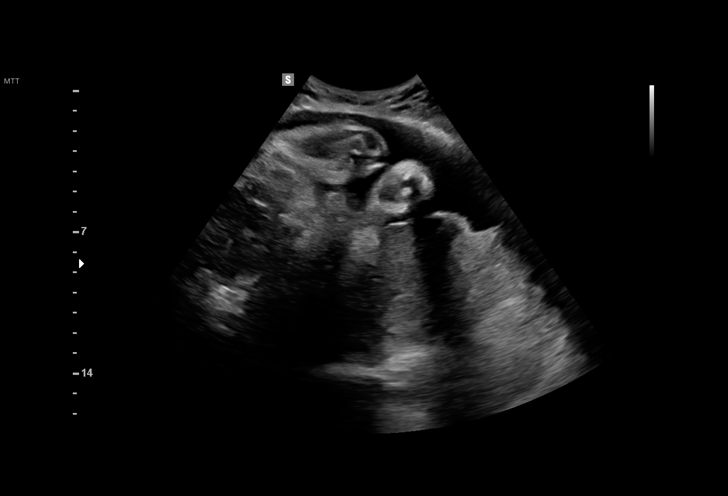
[im 97/105]
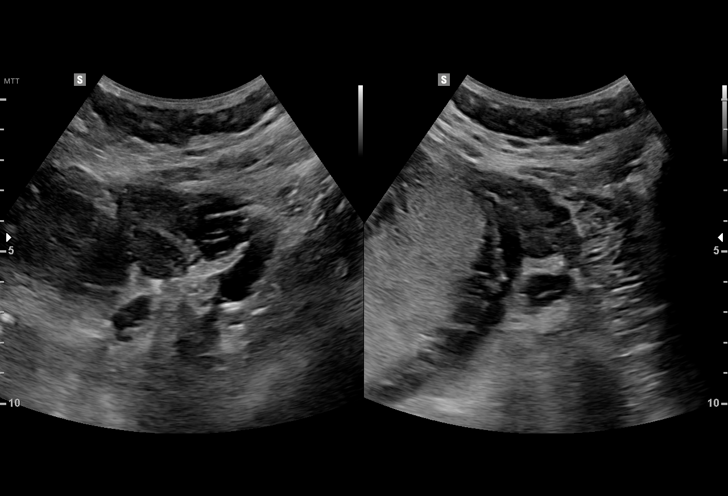
[im 105/105]
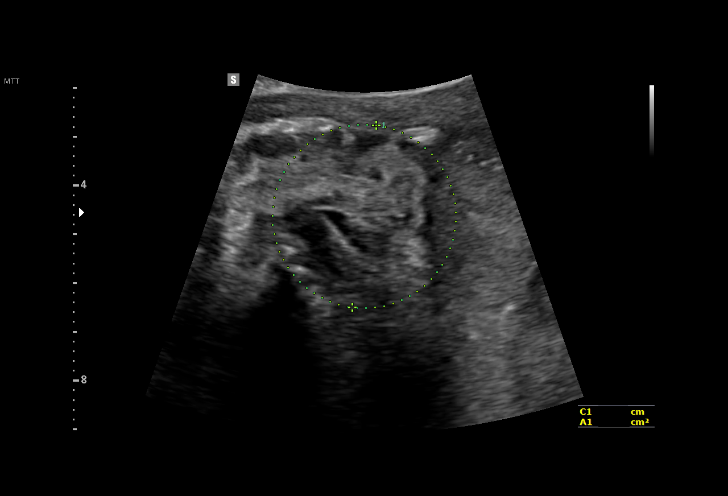

[14 of 28 positions shown; findings below may reference images not displayed]

[REDACTED] Clinic-
Faculty Physician
OB/Gyn Clinic

1  ARGYLE            [id number]       [id number]     [id number]
Indications

30 weeks gestation of pregnancy
Gestational diabetes in pregnancy, diet
controlled
Basic anatomic survey                          Z36
OB History

Gravidity:    3         Term:   2        Prem:   0        SAB:   0
TOP:          0       Ectopic:  0        Living: 2
Fetal Evaluation

Num Of Fetuses:     1
Fetal Heart         153
Rate(bpm):
Cardiac Activity:   Observed
Presentation:       Cephalic
Placenta:           Posterior, above cervical os
P. Cord Insertion:  Visualized, central

Amniotic Fluid
AFI FV:      Subjectively low-normal
AFI Sum(cm)     %Tile       Largest Pocket(cm)
8.42            4

RUQ(cm)       RLQ(cm)       LUQ(cm)        LLQ(cm)
3.45          3.47          1.5            0
Biometry

BPD:        78  mm     G. Age:  31w 2d         53  %    CI:        75.16   %   70 - 86
FL/HC:      20.2   %   19.3 -
HC:      285.4  mm     G. Age:  31w 2d         27  %    HC/AC:      0.97       0.96 -
AC:      293.8  mm     G. Age:  33w 3d         97  %    FL/BPD:     73.8   %   71 - 87
FL:       57.6  mm     G. Age:  30w 1d         19  %    FL/AC:      19.6   %   20 - 24
HUM:      52.2  mm     G. Age:  30w 3d         44  %
CER:      39.2  mm     G. Age:  33w 1d         88  %

CM:          4  mm

Est. FW:    7517  gm      4 lb 3 oz     76  %
Gestational Age

LMP:           30w 6d       Date:   10/10/15                 EDD:   07/16/16
U/S Today:     31w 4d                                        EDD:   07/11/16
Best:          30w 6d    Det. By:   LMP  (10/10/15)          EDD:   07/16/16
Anatomy

Cranium:               Appears normal         Aortic Arch:            Appears normal
Cavum:                 Appears normal         Ductal Arch:            Appears normal
Ventricles:            Appears normal         Diaphragm:              Appears normal
Choroid Plexus:        Appears normal         Stomach:                Appears normal, left
sided
Cerebellum:            Appears normal         Abdomen:                Appears normal
Posterior Fossa:       Appears normal         Abdominal Wall:         Appears nml (cord
insert, abd wall)
Nuchal Fold:           Not applicable (>20    Cord Vessels:           Appears normal (3
wks GA)                                        vessel cord)
Face:                  Appears normal         Kidneys:                Appear normal
(orbits and profile)
Lips:                  Not well visualized    Bladder:                Appears normal
Thoracic:              Appears normal         Spine:                  Limited views
appear normal
Heart:                 Appears normal         Upper Extremities:      Visualized
(4CH, axis, and
situs)
RVOT:                  Appears normal         Lower Extremities:      Visualized
LVOT:                  Appears normal

Other:  Fetus appears to be a female. Nasal bone visualized. Right 5th digit
visualized. Technically difficult due to advanced GA and fetal position.
Cervix Uterus Adnexa

Cervix
Not visualized (advanced GA >65wks)

Uterus
No abnormality visualized.

Left Ovary
Size(cm)     3.25  x    2.19   x  2.15      Vol(ml): 8
Within normal limits. No adnexal mass visualized.
Right Ovary
Size(cm)       2.6 x    2.26   x  1.46      Vol(ml):
Within normal limits. No adnexal mass visualized.

Cul De Sac:   No free fluid seen.

Adnexa:       No abnormality visualized.
Impression

Single IUP at 30w 6d
A1 GDM
Somewhat limited views of the fetal spine and face obtained
due to fetal position
The remainder of the fetal anatomy appears normal
The estimated fetal weight is at RITTIK TASFAYE 76th %tile.
Posterior placenta without previa
Normal amniotic fluid volume
Recommendations

Recommend follow-up ultrasound examination in 4 weeks for
growth

## 2016-08-05 ENCOUNTER — Encounter: Payer: Self-pay | Admitting: *Deleted

## 2016-09-17 IMAGING — US US MFM OB FOLLOW-UP
1 series · 14 of 28 positions shown · non-contrast
Comparison: none

[Series 1: us mfm ob follow-up · 90 acquisitions, 14 frames shown]
[im 4/90]
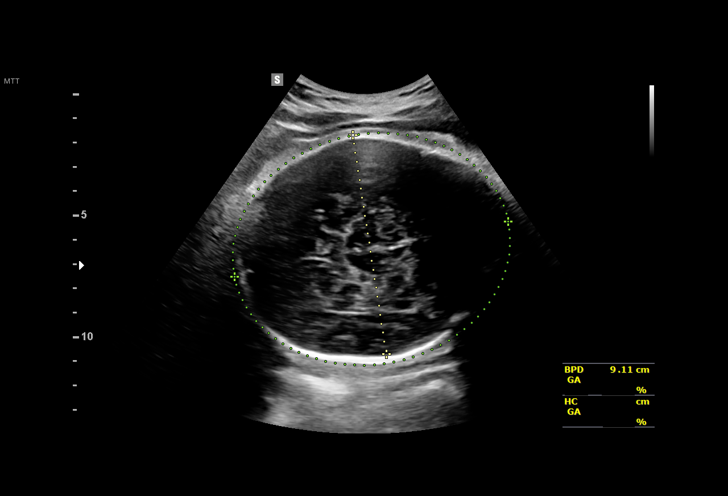
[im 10/90]
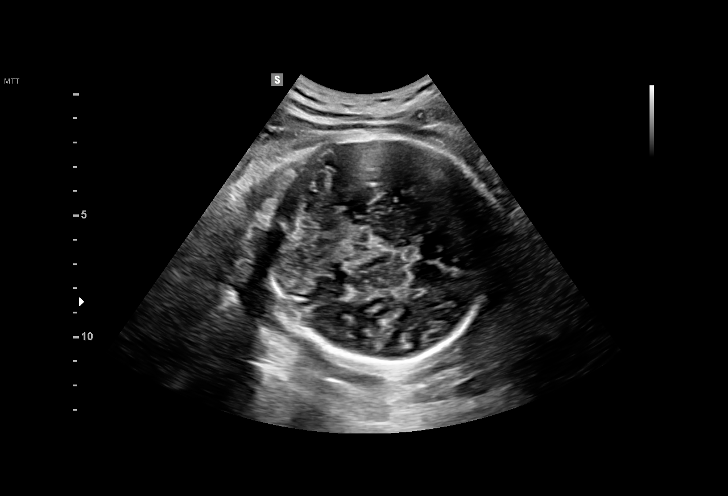
[im 17/90]
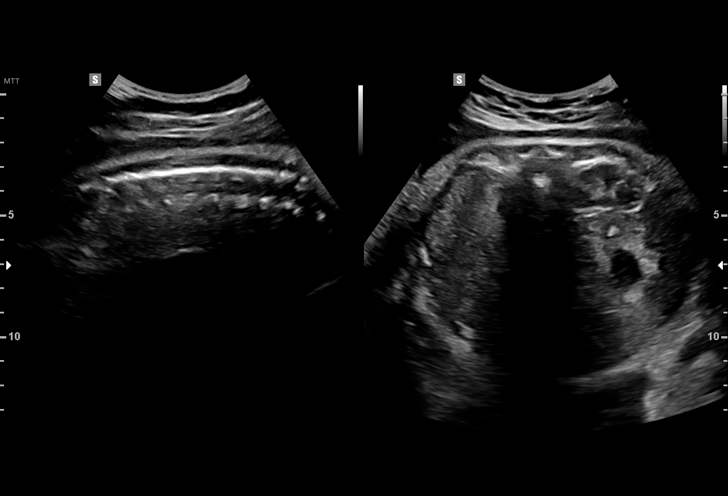
[im 24/90]
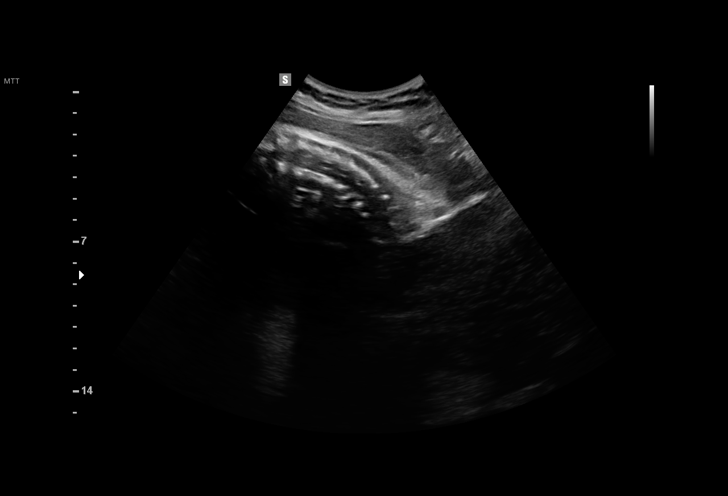
[im 30/90]
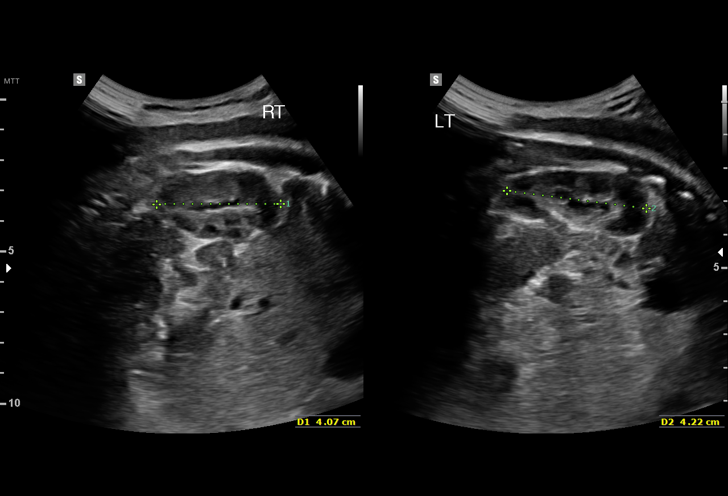
[im 37/90]
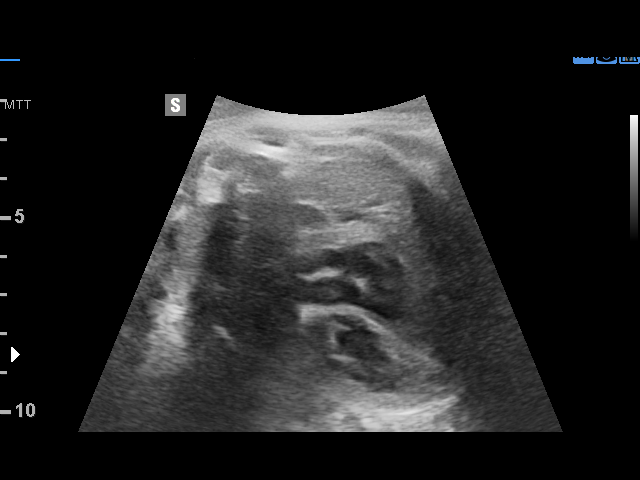
[im 43/90]
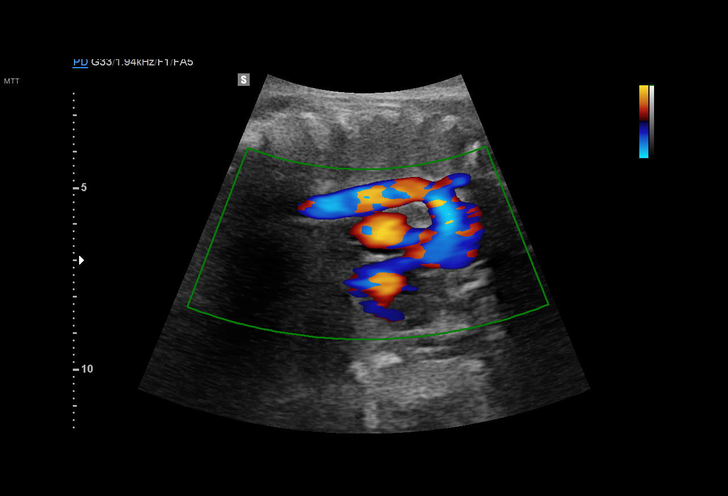
[im 50/90]
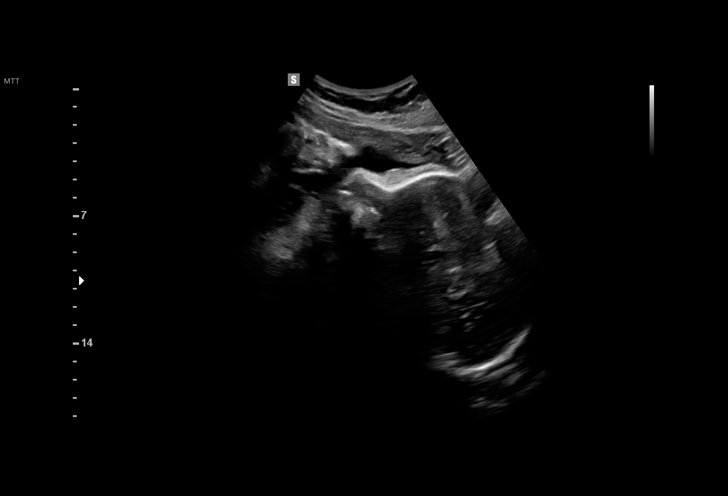
[im 57/90]
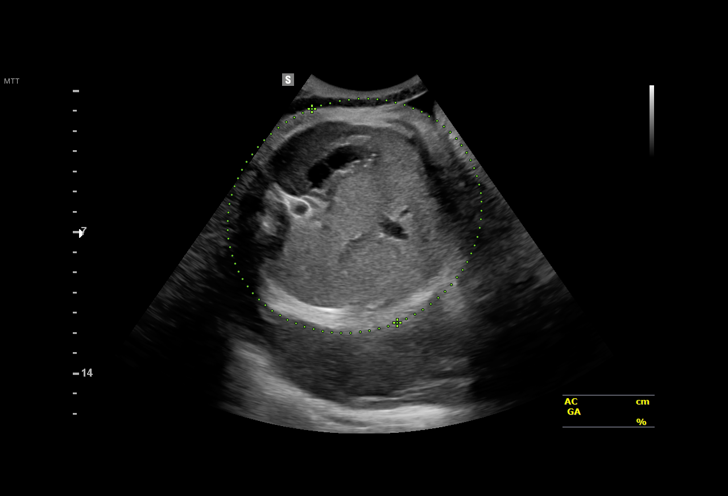
[im 63/90]
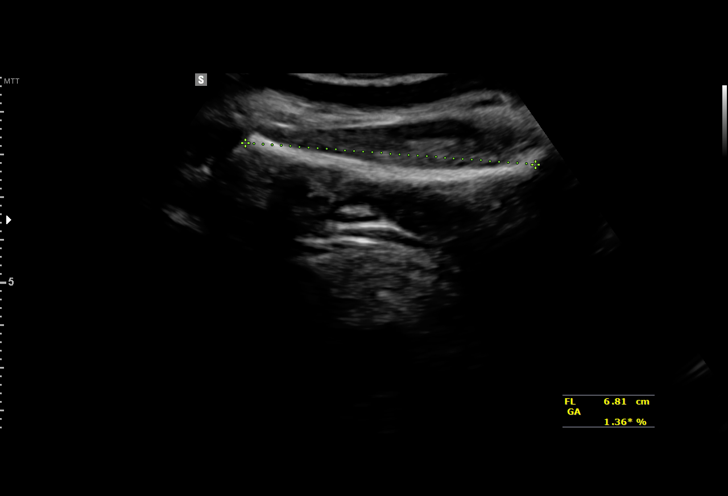
[im 70/90]
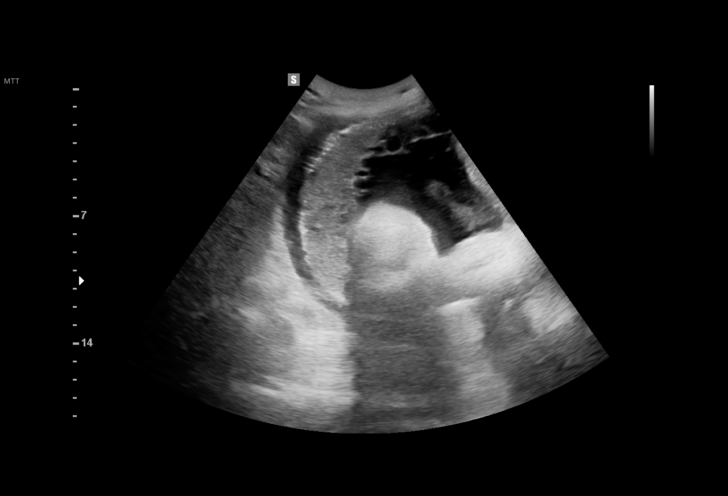
[im 76/90]
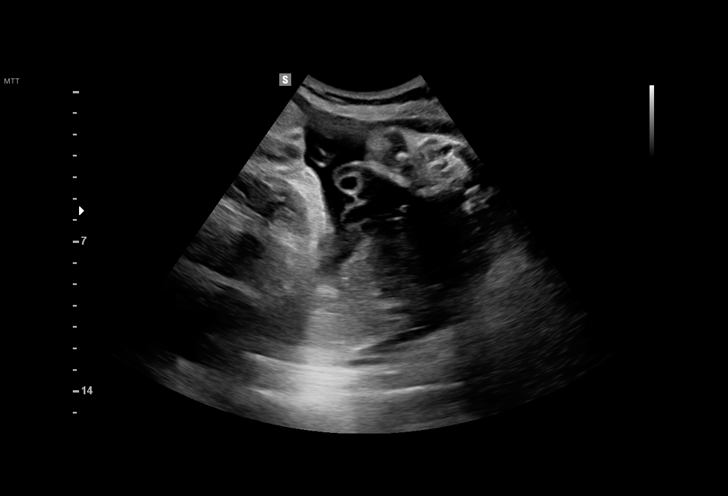
[im 83/90]
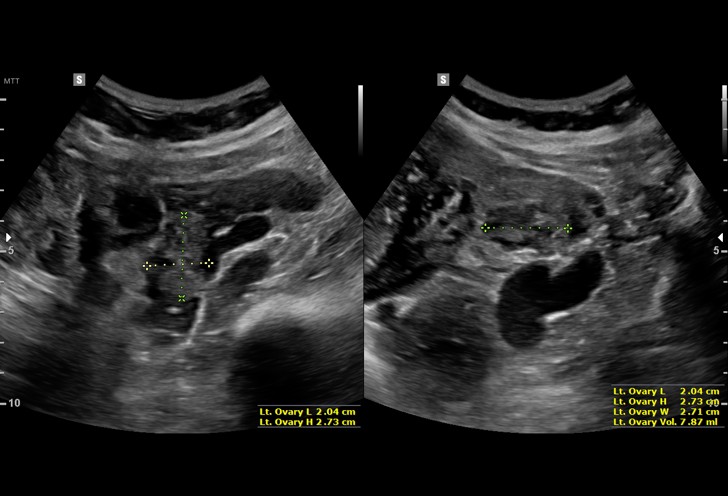
[im 90/90]
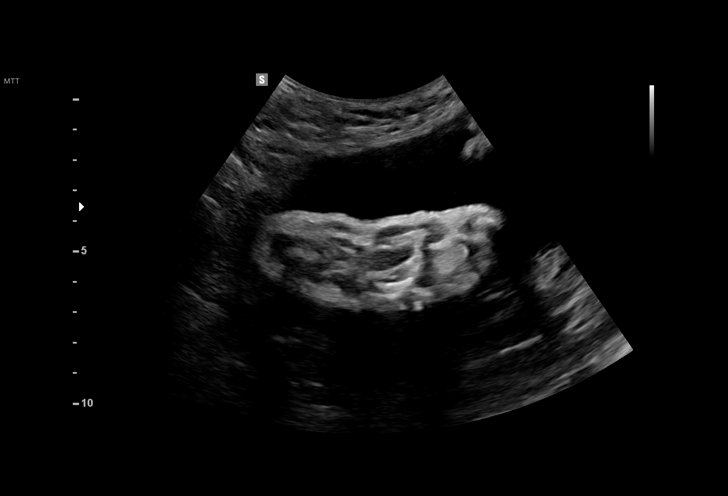

[14 of 28 positions shown; findings below may reference images not displayed]

[REDACTED] Clinic-
Faculty Physician
OB/Gyn Clinic

1  DENIA            [id number]       [id number]     [id number]
Indications

38 weeks gestation of pregnancy
Gestational diabetes in pregnancy, diet
controlled
Evaluate anatomy not seen on prior             Z36
sonogram
OB History

Gravidity:    3         Term:   2        Prem:   0         SAB:   0
TOP:          0       Ectopic:  0        Living: 2
Fetal Evaluation

Num Of Fetuses:     1
Fetal Heart         150
Rate(bpm):
Cardiac Activity:   Observed
Presentation:       Cephalic
Placenta:           Posterior, above cervical os
P. Cord Insertion:  Previously Visualized
Amniotic Fluid
AFI FV:      Subjectively within normal limits

AFI Sum(cm)     %Tile       Largest Pocket(cm)
10.12           27

RUQ(cm)       RLQ(cm)       LUQ(cm)        LLQ(cm)
1.7
Biometry

BPD:      91.5  mm     G. Age:  37w 1d         42  %    CI:         76.39  %    70 - 86
FL/HC:       20.7  %    20.9 -
HC:      331.7  mm     G. Age:  37w 6d         21  %    HC/AC:       0.88       0.92 -
AC:      377.4  mm     G. Age:  41w 4d       > 97  %    FL/BPD:      75.0  %    71 - 87
FL:       68.6  mm     G. Age:  35w 1d        < 3  %    FL/AC:       18.2  %    20 - 24
HUM:      60.7  mm     G. Age:  35w 1d         14  %
CER:      53.2  mm     G. Age:  N/A          > 95  %

Est. FW:    0668   gm     8 lb 5 oz   > 90  %
Gestational Age

LMP:           38w 3d        Date:  10/10/15                 EDD:    07/16/16
U/S Today:     38w 0d                                        EDD:    07/19/16
Best:          38w 3d     Det. By:  LMP  (10/10/15)          EDD:    07/16/16
Anatomy

Cranium:               Appears normal         Aortic Arch:            Previously seen
Cavum:                 Previously seen        Ductal Arch:            Previously seen
Ventricles:            Appears normal         Diaphragm:              Appears normal
Choroid Plexus:        Previously seen        Stomach:                Appears normal, left
sided
Cerebellum:            Previously seen        Abdomen:                Previously seen
Posterior Fossa:       Previously seen        Abdominal Wall:         Previously seen
Nuchal Fold:           Not applicable (>20    Cord Vessels:           Appears normal (3
wks GA)                                        vessel cord)
Face:                  Orbits and profile     Kidneys:                Appear normal
previously seen
Lips:                  Not well visualized    Bladder:                Appears normal
Thoracic:              Previously seen        Spine:                  Limited views
appear normal
Heart:                 Previously seen        Upper Extremities:      Visualized Prev.
RVOT:                  Previously seen        Lower Extremities:      Visualized Prev.
LVOT:                  Previously seen

Other:  Female gender previously seen. Nasal bone previously seen. Right
5th digit visualized previously. Technically difficult due to advanced
GA and fetal position.
Cervix Uterus Adnexa

Cervix
Not visualized (advanced GA >72wks)

Uterus
No abnormality visualized.
Left Ovary
Size(cm)     2.04   x   2.71   x  2.73      Vol(ml):
Within normal limits. No adnexal mass visualized.

Right Ovary
Size(cm)       2.8  x   1.61   x  1.11      Vol(ml):
Within normal limits. No adnexal mass visualized.

Cul De Sac:   No free fluid seen.

Adnexa:       No abnormality visualized.
Impression

SIUP at 38+3 weeks
Normal interval anatomy; anatomic survey complete except
for facial views
Normal amniotic fluid volume
EFW > 90th %tile; AC > 97th %tile; 0668 grams, 8+5
Recommendations

Follow-up as clinically indicated

## 2016-09-21 ENCOUNTER — Ambulatory Visit: Payer: Self-pay | Admitting: Advanced Practice Midwife
